# Patient Record
Sex: Female | Born: 1965 | Race: Black or African American | Hispanic: No | Marital: Single
Health system: Southern US, Academic
[De-identification: ages and names within clinical notes are randomized; demographics above are authoritative.]

## PROBLEM LIST (undated history)

## (undated) ENCOUNTER — Ambulatory Visit: Payer: BLUE CROSS/BLUE SHIELD

## (undated) ENCOUNTER — Encounter: Attending: Adult Health | Primary: Adult Health

## (undated) ENCOUNTER — Encounter

## (undated) ENCOUNTER — Ambulatory Visit
Payer: BLUE CROSS/BLUE SHIELD | Attending: Rehabilitative and Restorative Service Providers" | Primary: Rehabilitative and Restorative Service Providers"

## (undated) ENCOUNTER — Ambulatory Visit: Payer: Medicare (Managed Care)

## (undated) ENCOUNTER — Encounter: Attending: Family | Primary: Family

## (undated) ENCOUNTER — Telehealth

## (undated) ENCOUNTER — Ambulatory Visit: Payer: Medicare (Managed Care) | Attending: Family | Primary: Family

## (undated) ENCOUNTER — Encounter: Attending: Internal Medicine | Primary: Internal Medicine

## (undated) ENCOUNTER — Ambulatory Visit

## (undated) ENCOUNTER — Encounter: Attending: Gastroenterology | Primary: Gastroenterology

## (undated) ENCOUNTER — Ambulatory Visit
Attending: Rehabilitative and Restorative Service Providers" | Primary: Rehabilitative and Restorative Service Providers"

## (undated) ENCOUNTER — Ambulatory Visit: Attending: Internal Medicine | Primary: Internal Medicine

## (undated) ENCOUNTER — Non-Acute Institutional Stay: Payer: PRIVATE HEALTH INSURANCE

## (undated) ENCOUNTER — Telehealth: Attending: Adult Health | Primary: Adult Health

## (undated) ENCOUNTER — Ambulatory Visit: Payer: PRIVATE HEALTH INSURANCE

## (undated) ENCOUNTER — Ambulatory Visit: Payer: MEDICAID | Attending: Internal Medicine | Primary: Internal Medicine

## (undated) ENCOUNTER — Ambulatory Visit: Payer: Medicare (Managed Care) | Attending: Internal Medicine | Primary: Internal Medicine

## (undated) ENCOUNTER — Ambulatory Visit: Payer: BLUE CROSS/BLUE SHIELD | Attending: Internal Medicine | Primary: Internal Medicine

## (undated) ENCOUNTER — Telehealth: Attending: Family | Primary: Family

## (undated) ENCOUNTER — Ambulatory Visit: Attending: Physician Assistant | Primary: Physician Assistant

## (undated) ENCOUNTER — Ambulatory Visit: Payer: PRIVATE HEALTH INSURANCE | Attending: Gastroenterology | Primary: Gastroenterology

## (undated) ENCOUNTER — Encounter: Payer: PRIVATE HEALTH INSURANCE | Attending: Gastroenterology | Primary: Gastroenterology

## (undated) ENCOUNTER — Inpatient Hospital Stay: Payer: MEDICARE

## (undated) DIAGNOSIS — E079 Disorder of thyroid, unspecified: Secondary | ICD-10-CM

## (undated) DIAGNOSIS — I10 Essential (primary) hypertension: Secondary | ICD-10-CM

## (undated) DIAGNOSIS — J45909 Unspecified asthma, uncomplicated: Secondary | ICD-10-CM

## (undated) DIAGNOSIS — M1990 Unspecified osteoarthritis, unspecified site: Secondary | ICD-10-CM

## (undated) HISTORY — PX: THYROID SURGERY: SHX805

## (undated) HISTORY — PX: APPENDECTOMY: SHX54

---

## 2019-02-19 ENCOUNTER — Encounter: Payer: Self-pay | Admitting: Emergency Medicine

## 2019-02-19 ENCOUNTER — Other Ambulatory Visit: Payer: Self-pay

## 2019-02-19 ENCOUNTER — Emergency Department: Payer: Self-pay

## 2019-02-19 ENCOUNTER — Emergency Department
Admission: EM | Admit: 2019-02-19 | Discharge: 2019-02-19 | Disposition: A | Discharge status: Home / Self Care | Payer: Self-pay | Attending: Student in an Organized Health Care Education/Training Program | Admitting: Student in an Organized Health Care Education/Training Program

## 2019-02-19 DIAGNOSIS — J45909 Unspecified asthma, uncomplicated: Secondary | ICD-10-CM | POA: Insufficient documentation

## 2019-02-19 DIAGNOSIS — Z87891 Personal history of nicotine dependence: Secondary | ICD-10-CM | POA: Insufficient documentation

## 2019-02-19 DIAGNOSIS — R0602 Shortness of breath: Secondary | ICD-10-CM

## 2019-02-19 DIAGNOSIS — K219 Gastro-esophageal reflux disease without esophagitis: Secondary | ICD-10-CM | POA: Insufficient documentation

## 2019-02-19 DIAGNOSIS — I10 Essential (primary) hypertension: Secondary | ICD-10-CM | POA: Insufficient documentation

## 2019-02-19 DIAGNOSIS — M79605 Pain in left leg: Secondary | ICD-10-CM

## 2019-02-19 DIAGNOSIS — R51 Headache: Secondary | ICD-10-CM | POA: Insufficient documentation

## 2019-02-19 DIAGNOSIS — E039 Hypothyroidism, unspecified: Secondary | ICD-10-CM | POA: Insufficient documentation

## 2019-02-19 HISTORY — DX: Unspecified asthma, uncomplicated: J45.909

## 2019-02-19 HISTORY — DX: Disorder of thyroid, unspecified: E07.9

## 2019-02-19 HISTORY — DX: Unspecified osteoarthritis, unspecified site: M19.90

## 2019-02-19 HISTORY — DX: Essential (primary) hypertension: I10

## 2019-02-19 LAB — CBC
HCT: 42.1 % (ref 36.0–46.0)
Hemoglobin: 14 g/dL (ref 12.0–15.0)
MCH: 29.1 pg (ref 26.0–34.0)
MCHC: 33.3 g/dL (ref 30.0–36.0)
MCV: 87.5 fL (ref 80.0–100.0)
Platelets: 189 10*3/uL (ref 150–400)
RBC: 4.81 MIL/uL (ref 3.87–5.11)
RDW: 14.5 % (ref 11.5–15.5)
WBC: 8.8 10*3/uL (ref 4.0–10.5)
nRBC: 0 % (ref 0.0–0.2)

## 2019-02-19 LAB — BASIC METABOLIC PANEL
Anion gap: 9 (ref 5–15)
BUN: 15 mg/dL (ref 6–20)
CO2: 26 mmol/L (ref 22–32)
Calcium: 9.3 mg/dL (ref 8.9–10.3)
Chloride: 107 mmol/L (ref 98–111)
Creatinine, Ser: 1 mg/dL (ref 0.44–1.00)
GFR calc Af Amer: >60 mL/min (ref >60)
GFR calc non Af Amer: >60 mL/min (ref >60)
Glucose, Bld: 104 mg/dL — ABNORMAL HIGH (ref 70–99)
Potassium: 3.6 mmol/L (ref 3.5–5.1)
Sodium: 142 mmol/L (ref 135–145)

## 2019-02-19 LAB — TROPONIN I (HIGH SENSITIVITY)
Troponin I (High Sensitivity): 3 ng/L (ref <18)
Troponin I (High Sensitivity): <2 ng/L (ref <18)

## 2019-02-19 LAB — POCT PREGNANCY, URINE: Preg Test, Ur: NEGATIVE

## 2019-02-19 LAB — LIPASE, BLOOD: Lipase: 28 U/L (ref 11–51)

## 2019-02-19 MED ORDER — ACETAMINOPHEN 500 MG PO TABS
1000.0000 mg | ORAL_TABLET | Freq: Once | ORAL | Status: AC
  Administered 2019-02-19: 21:00:00 1000 mg via ORAL

## 2019-02-19 MED ORDER — SODIUM CHLORIDE 0.9% FLUSH: 3.0000 mL | Freq: Once | INTRAVENOUS | Status: DC

## 2019-02-19 MED ORDER — ACETAMINOPHEN 500 MG PO TABS
ORAL_TABLET | ORAL | Status: AC
  Filled 2019-02-19: qty 2

## 2019-02-19 NOTE — ED Notes (Signed)
Pt also requesting pain medication. MD made aware and gave verbal order pt could have tylenol.

## 2019-02-19 NOTE — ED Notes (Signed)
RN into room to discharge patient and pt asked about medication to treat a UTI. RN told pt she did not have a urine sample tested and pt insisted a nurse told her she had a UTI and rechecked her temp in the lobby. MD made aware and pt requesting her urine be checked.

## 2019-02-19 NOTE — ED Notes (Signed)
X-ray at bedside

## 2019-02-19 NOTE — ED Provider Notes (Signed)
[facility] Emergency Department Provider Note    First MD Initiated Contact with Patient 02/19/19 1910     (approximate)  I have reviewed the triage vital signs and the nursing notes.   HISTORY  Chief Complaint Chest Pain, Leg Pain, Gastroesophageal Reflux, Abdominal Pain, Shortness of Breath, and Headache    HPI Karen Ewing is a 53 y.o. female the below listed past medical history presents for evaluation of left leg pain.  Denies any trauma.  Also has multiple complaints reported in triage but seems to be that she is complaining of left leg pain is the primary reason for her visit today.  She currently denies any headache or weakness.  No chest pain or shortness of breath.  No fevers.  Is worried that she could have a blood clot.  She is not on any blood thinners.  No new medications.  Does not take a statin.    Past Medical History:  Diagnosis Date  . Arthritis   . Asthma   . Hypertension   . Thyroid disease    No family history on file. Past Surgical History:  Procedure Laterality Date  . APPENDECTOMY    . THYROID SURGERY     There are no active problems to display for this patient.     Prior to Admission medications   Not on File    Allergies Patient has no known allergies.    Social History Social History   Tobacco Use  . Smoking status: Former Games developer  . Smokeless tobacco: Never Used  Substance Use Topics  . Alcohol use: Yes    Comment: occas.   . Drug use: Not on file    Review of Systems Patient denies headaches, rhinorrhea, blurry vision, numbness, shortness of breath, chest pain, edema, cough, abdominal pain, nausea, vomiting, diarrhea, dysuria, fevers, rashes or hallucinations unless otherwise stated above in HPI. ____________________________________________   PHYSICAL EXAM:  VITAL SIGNS: Vitals:   02/19/19 1731 02/19/19 2117  BP: 133/88 120/82  Pulse: 92 90  Resp: 18 16  Temp: 99 F (37.2 C) 98.8 F  (37.1 C)  SpO2: 98% 100%    Constitutional: Alert and oriented.  Eyes: Conjunctivae are normal.  Head: Atraumatic. Nose: No congestion/rhinnorhea. Mouth/Throat: Mucous membranes are moist.   Neck: No stridor. Painless ROM.  Cardiovascular: Normal rate, regular rhythm. Grossly normal heart sounds.  Good peripheral circulation. Respiratory: Normal respiratory effort.  No retractions. Lungs CTAB. Gastrointestinal: Soft and nontender. No distention. No abdominal bruits. No CVA tenderness. Genitourinary:  Musculoskeletal: No lower extremity tenderness nor edema.  Compartments are soft.  Some crepitus with range of motion of the left knee.  Some pain with flexion of the left hip but no overlying warmth or cellulitis.  Has strong PT and DP pulses distally.  No joint effusions. Neurologic:  Normal speech and language. No gross focal neurologic deficits are appreciated. No facial droop Skin:  Skin is warm, dry and intact. No rash noted. Psychiatric: Mood and affect are normal. Speech and behavior are normal.  ____________________________________________   LABS (all labs ordered are listed, but only abnormal results are displayed)  Results for orders placed or performed during the hospital encounter of 02/19/19 (from the past 24 hour(s))  Basic metabolic panel     Status: Abnormal   Collection Time: 02/19/19  5:53 PM  Result Value Ref Range   Sodium 142 135 - 145 mmol/L   Potassium 3.6 3.5 - 5.1 mmol/L   Chloride 107 98 -  111 mmol/L   CO2 26 22 - 32 mmol/L   Glucose, Bld 104 (H) 70 - 99 mg/dL   BUN 15 6 - 20 mg/dL   Creatinine, Ser 1.191.00 0.44 - 1.00 mg/dL   Calcium 9.3 8.9 - 14.710.3 mg/dL   GFR calc non Af Amer >60 >60 mL/min   GFR calc Af Amer >60 >60 mL/min   Anion gap 9 5 - 15  CBC     Status: None   Collection Time: 02/19/19  5:53 PM  Result Value Ref Range   WBC 8.8 4.0 - 10.5 K/uL   RBC 4.81 3.87 - 5.11 MIL/uL   Hemoglobin 14.0 12.0 - 15.0 g/dL   HCT 82.942.1 56.236.0 - 13.046.0 %   MCV  87.5 80.0 - 100.0 fL   MCH 29.1 26.0 - 34.0 pg   MCHC 33.3 30.0 - 36.0 g/dL   RDW 86.514.5 78.411.5 - 69.615.5 %   Platelets 189 150 - 400 K/uL   nRBC 0.0 0.0 - 0.2 %  Troponin I (High Sensitivity)     Status: None   Collection Time: 02/19/19  5:53 PM  Result Value Ref Range   Troponin I (High Sensitivity) 3 <18 ng/L  Lipase, blood     Status: None   Collection Time: 02/19/19  5:53 PM  Result Value Ref Range   Lipase 28 11 - 51 U/L  Pregnancy, urine POC     Status: None   Collection Time: 02/19/19  6:10 PM  Result Value Ref Range   Preg Test, Ur NEGATIVE NEGATIVE  Troponin I (High Sensitivity)     Status: None   Collection Time: 02/19/19  7:48 PM  Result Value Ref Range   Troponin I (High Sensitivity) <2 <18 ng/L   ____________________________________________  EKG My review and personal interpretation at Time: 17:31   Indication: leg pain  Rate: 75  Rhythm: sinus Axis: normal Other: nonspecific st abn, no stemi ____________________________________________  RADIOLOGY  I personally reviewed all radiographic images ordered to evaluate for the above acute complaints and reviewed radiology reports and findings.  These findings were personally discussed with the patient.  Please see medical record for radiology report.  ____________________________________________   PROCEDURES  Procedure(s) performed:  Procedures    Critical Care performed: no ____________________________________________   INITIAL IMPRESSION / ASSESSMENT AND PLAN / ED COURSE  Pertinent labs & imaging results that were available during my care of the patient were reviewed by me and considered in my medical decision making (see chart for details).   DDX: DVT, Baker's cyst, claudication, arthritis, electrolyte abnormality  Karen Ewing is a 53 y.o. who presents to the ED with multiple complaints but primarily seems to be having left leg discomfort.  Blood work is reassuring.  Neuro exam is reassuring.  Not  having active chest pain or pressure at this time.  EKG is abnormal without any clear evidence of acute ischemia.  She is not having any pain at this time.  Likely her baseline given initial troponin being negative but will order serial enzymes.  Will evaluate for DVT with ultrasound.  Seems primarily musculoskeletal.  No evidence of cellulitis.  Has good distal perfusion.  No evidence of ischemic limb.  Her abdominal exam is soft and benign.  She is also reporting intermittent episodes of having trouble grasping things but she has good strength bilaterally with no neck or back pain.     Imaging is reassuring.  Repeat exam soft and benign with again reassuring neuro exam.  He seemed to be primarily chronic issues for the patient at this point I will give her referral to primary care physician.  We discussed signs and symptoms for which she should return to the ER.  The patient was evaluated in Emergency Department today for the symptoms described in the history of present illness. He/she was evaluated in the context of the global COVID-19 pandemic, which necessitated consideration that the patient might be at risk for infection with the SARS-CoV-2 virus that causes COVID-19. Institutional protocols and algorithms that pertain to the evaluation of patients at risk for COVID-19 are in a state of rapid change based on information released by regulatory bodies including the CDC and federal and state organizations. These policies and algorithms were followed during the patient's care in the ED.  As part of my medical decision making, I reviewed the following data within the [facility] notes reviewed and incorporated, Labs reviewed, notes from prior ED visits and [place] Controlled Substance Database   ____________________________________________   FINAL CLINICAL IMPRESSION(S) / ED DIAGNOSES  Final diagnoses:  Left leg pain      NEW MEDICATIONS STARTED DURING THIS VISIT:  There  are no discharge medications for this patient.    Note:  This document was prepared using Dragon voice recognition software and may include unintentional dictation errors.    Merlyn Lot, MD 02/19/19 2205

## 2019-02-19 NOTE — ED Notes (Signed)
US at bedside

## 2019-02-19 NOTE — ED Notes (Signed)
Pt discharged from ED after reporting she did not want to wait for urine. Lab was contacted and confirmed there was a urine sample in lab. After not finding results RN called lab again and discovered there was no urine in lab. Pt did not have urinary symptoms and only had urine ordered after false results were given.

## 2019-02-19 NOTE — Discharge Instructions (Addendum)
Please call [facility] to establish care.  Fortunately, your work up in the ER today is reassuring.  Please return if you have develop any new symptoms or concerns.

## 2019-02-19 NOTE — ED Triage Notes (Signed)
Pt is here with multiple complaints, states heaviness in her head, chest tightness, occasional shob, left leg pain and swelling, abd pain in the mid abd region, GERD, and occasionally drops things that she is holding, only diagnosis is thyroid issues. NAD. All complaints have started in the last couple of weeks.

## 2019-02-19 NOTE — ED Notes (Signed)
See triage note.  Pt states pain to left leg with some swelling, no obvious swelling noted upon assessment.  Pt also states chest tightness and occasionally having to catch her breath, and weakness "jumping from arm to arm".

## 2019-02-19 NOTE — ED Notes (Signed)
ED Provider at bedside. 

## 2019-04-16 ENCOUNTER — Other Ambulatory Visit: Payer: Self-pay

## 2019-04-16 ENCOUNTER — Emergency Department
Admission: EM | Admit: 2019-04-16 | Discharge: 2019-04-16 | Disposition: A | Discharge status: Home / Self Care | Payer: No Typology Code available for payment source | Attending: Emergency Medicine | Admitting: Emergency Medicine

## 2019-04-16 DIAGNOSIS — M79622 Pain in left upper arm: Secondary | ICD-10-CM | POA: Insufficient documentation

## 2019-04-16 DIAGNOSIS — Y92410 Unspecified street and highway as the place of occurrence of the external cause: Secondary | ICD-10-CM | POA: Diagnosis not present

## 2019-04-16 DIAGNOSIS — Y93I9 Activity, other involving external motion: Secondary | ICD-10-CM | POA: Diagnosis not present

## 2019-04-16 DIAGNOSIS — Y999 Unspecified external cause status: Secondary | ICD-10-CM | POA: Insufficient documentation

## 2019-04-16 DIAGNOSIS — M7918 Myalgia, other site: Secondary | ICD-10-CM

## 2019-04-16 DIAGNOSIS — M79621 Pain in right upper arm: Secondary | ICD-10-CM | POA: Insufficient documentation

## 2019-04-16 DIAGNOSIS — M545 Low back pain: Secondary | ICD-10-CM | POA: Diagnosis not present

## 2019-04-16 DIAGNOSIS — J0100 Acute maxillary sinusitis, unspecified: Secondary | ICD-10-CM | POA: Diagnosis not present

## 2019-04-16 DIAGNOSIS — I10 Essential (primary) hypertension: Secondary | ICD-10-CM | POA: Diagnosis not present

## 2019-04-16 DIAGNOSIS — Z87891 Personal history of nicotine dependence: Secondary | ICD-10-CM | POA: Insufficient documentation

## 2019-04-16 DIAGNOSIS — J45909 Unspecified asthma, uncomplicated: Secondary | ICD-10-CM | POA: Insufficient documentation

## 2019-04-16 MED ORDER — PSEUDOEPHEDRINE HCL ER 120 MG PO TB12: 120.0000 mg | ORAL_TABLET | Freq: Two times a day (BID) | ORAL | 0 refills | Status: AC | PRN

## 2019-04-16 MED ORDER — TRAMADOL HCL 50 MG PO TABS: 50.0000 mg | ORAL_TABLET | Freq: Four times a day (QID) | ORAL | 0 refills | Status: DC | PRN

## 2019-04-16 MED ORDER — IBUPROFEN 600 MG PO TABS: 600.0000 mg | ORAL_TABLET | Freq: Three times a day (TID) | ORAL | 0 refills | Status: DC | PRN

## 2019-04-16 MED ORDER — AMOXICILLIN 500 MG PO CAPS: 500.0000 mg | ORAL_CAPSULE | Freq: Three times a day (TID) | ORAL | 0 refills | Status: DC

## 2019-04-16 MED ORDER — PSEUDOEPHEDRINE HCL ER 120 MG PO TB12: 120.0000 mg | ORAL_TABLET | Freq: Two times a day (BID) | ORAL | 0 refills | Status: DC | PRN

## 2019-04-16 MED ORDER — TRAMADOL HCL 50 MG PO TABS: 50.0000 mg | ORAL_TABLET | Freq: Four times a day (QID) | ORAL | 0 refills | Status: AC | PRN

## 2019-04-16 MED ORDER — CYCLOBENZAPRINE HCL 10 MG PO TABS: 10.0000 mg | ORAL_TABLET | Freq: Three times a day (TID) | ORAL | 0 refills | Status: DC | PRN
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
## 2019-04-26 ENCOUNTER — Other Ambulatory Visit: Payer: Self-pay | Admitting: Chiropractor

## 2019-04-26 ENCOUNTER — Ambulatory Visit
Admission: RE | Admit: 2019-04-26 | Discharge: 2019-04-26 | Disposition: A | Discharge status: Home / Self Care | Payer: No Typology Code available for payment source | Attending: Chiropractor | Admitting: Chiropractor

## 2019-04-26 ENCOUNTER — Ambulatory Visit
Admission: RE | Admit: 2019-04-26 | Discharge: 2019-04-26 | Disposition: A | Discharge status: Home / Self Care | Payer: No Typology Code available for payment source | Source: Ambulatory Visit | Attending: Chiropractor | Admitting: Chiropractor

## 2019-04-26 DIAGNOSIS — Y92410 Unspecified street and highway as the place of occurrence of the external cause: Secondary | ICD-10-CM | POA: Diagnosis not present

## 2019-04-26 DIAGNOSIS — M5031 Other cervical disc degeneration,  high cervical region: Secondary | ICD-10-CM | POA: Insufficient documentation

## 2019-04-26 DIAGNOSIS — M50321 Other cervical disc degeneration at C4-C5 level: Secondary | ICD-10-CM | POA: Insufficient documentation

## 2019-07-25 ENCOUNTER — Emergency Department
Admission: EM | Admit: 2019-07-25 | Discharge: 2019-07-25 | Disposition: A | Discharge status: Home / Self Care | Payer: HRSA Program | Attending: Emergency Medicine | Admitting: Emergency Medicine

## 2019-07-25 ENCOUNTER — Other Ambulatory Visit: Payer: Self-pay

## 2019-07-25 DIAGNOSIS — Z87891 Personal history of nicotine dependence: Secondary | ICD-10-CM | POA: Diagnosis not present

## 2019-07-25 DIAGNOSIS — J45909 Unspecified asthma, uncomplicated: Secondary | ICD-10-CM | POA: Insufficient documentation

## 2019-07-25 DIAGNOSIS — Z79899 Other long term (current) drug therapy: Secondary | ICD-10-CM | POA: Diagnosis not present

## 2019-07-25 DIAGNOSIS — U071 COVID-19: Secondary | ICD-10-CM | POA: Insufficient documentation

## 2019-07-25 DIAGNOSIS — J029 Acute pharyngitis, unspecified: Secondary | ICD-10-CM | POA: Diagnosis present

## 2019-07-25 DIAGNOSIS — I10 Essential (primary) hypertension: Secondary | ICD-10-CM | POA: Diagnosis not present

## 2019-07-25 LAB — POC SARS CORONAVIRUS 2 AG: SARS Coronavirus 2 Ag: POSITIVE — AB

## 2019-07-25 MED ORDER — HYDROCODONE-CHLORPHENIRAMINE 5-4 MG/5ML PO SOLN: 5.0000 mL | Freq: Four times a day (QID) | ORAL | 0 refills | Status: DC | PRN

## 2019-07-25 MED ORDER — BENZONATATE 100 MG PO CAPS: 100.0000 mg | ORAL_CAPSULE | Freq: Three times a day (TID) | ORAL | 0 refills | Status: DC | PRN

## 2019-07-25 MED ORDER — ALBUTEROL SULFATE HFA 108 (90 BASE) MCG/ACT IN AERS: INHALATION_SPRAY | RESPIRATORY_TRACT | 1 refills | Status: DC

## 2019-07-25 NOTE — ED Notes (Signed)
Pt reports sore throat and some sinus congestion with slight headache since yesterday. Upon inspection, throat appears minimally inflamed

## 2019-07-25 NOTE — ED Triage Notes (Signed)
Pt in with co sore, scratchy throat since yesterday. States had a fever today and chills.

## 2019-07-25 NOTE — ED Notes (Signed)
Pt reports taking 400 mg ibuprofen at approx 0100

## 2019-07-25 NOTE — ED Provider Notes (Signed)
[facility] Emergency Department Provider Note  ____________________________________________   First MD Initiated Contact with Patient 07/25/19 0215     (approximate)  I have reviewed the triage vital signs and the nursing notes.   HISTORY  Chief Complaint Sore Throat    HPI Karen Ewing is a 54 y.o. female with medical history as listed below who presents by private vehicle for evaluation of sore throat.  She said the symptoms started about 24 hours ago.  Over the last day she has also noticed subjective chills, generalized fatigue, and some generalized muscle aches.  She says the symptoms are mild but they worried her little bit.  She has not noticed a fever.  She denies loss of smell and taste, chest pain, shortness of breath, cough, nausea, vomiting, abdominal pain, and dysuria.  She is speaking without difficulty and eating and drinking and tolerating her secretions without difficulty.  Nothing in particular makes the symptoms better or worse.        Past Medical History:  Diagnosis Date  . Arthritis   . Asthma   . Hypertension   . Thyroid disease     There are no problems to display for this patient.   Past Surgical History:  Procedure Laterality Date  . APPENDECTOMY    . THYROID SURGERY      Prior to Admission medications   Medication Sig Start Date End Date Taking? Authorizing Provider  albuterol (VENTOLIN HFA) 108 (90 Base) MCG/ACT inhaler Inhale 2-4 puffs by mouth every 4 hours as needed for wheezing, cough, and/or shortness of breath 07/25/19   Loleta Rose, MD  amoxicillin (AMOXIL) 500 MG capsule Take 1 capsule (500 mg total) by mouth 3 (three) times daily. 04/16/19   Joni Reining, PA-C  benzonatate (TESSALON PERLES) 100 MG capsule Take 1 capsule (100 mg total) by mouth 3 (three) times daily as needed for cough. 07/25/19   Loleta Rose, MD  cyclobenzaprine (FLEXERIL) 10 MG tablet Take 1 tablet (10 mg total) by mouth 3 (three)  times daily as needed. 04/16/19   Joni Reining, PA-C  HYDROcodone-Chlorpheniramine 5-4 MG/5ML SOLN Take 5 mLs by mouth every 6 (six) hours as needed. 07/25/19   Loleta Rose, MD  ibuprofen (ADVIL) 600 MG tablet Take 1 tablet (600 mg total) by mouth every 8 (eight) hours as needed. 04/16/19   Joni Reining, PA-C  pseudoephedrine (SUDAFED) 120 MG 12 hr tablet Take 1 tablet (120 mg total) by mouth 2 (two) times daily as needed for congestion. 04/16/19 04/15/20  Joni Reining, PA-C  traMADol (ULTRAM) 50 MG tablet Take 1 tablet (50 mg total) by mouth every 6 (six) hours as needed. 04/16/19 04/15/20  Joni Reining, PA-C    Allergies Patient has no known allergies.  No family history on file.  Social History Social History   Tobacco Use  . Smoking status: Former Games developer  . Smokeless tobacco: Never Used  Substance Use Topics  . Alcohol use: Yes    Comment: occas.   . Drug use: Not on file    Review of Systems Constitutional: +chills Eyes: No visual changes ENT: +sore throat. Cardiovascular: Denies chest pain. Respiratory: Denies shortness of breath. Gastrointestinal: No abdominal pain.  No nausea, no vomiting.   Genitourinary: Negative for dysuria. Musculoskeletal: Some mild generalized body aches.  Negative for neck pain.  Negative for back pain. Integumentary: Negative for rash. Neurological: Negative for headaches, focal weakness or numbness.   ____________________________________________   PHYSICAL EXAM:  VITAL SIGNS: ED Triage Vitals [07/25/19 0126]  Enc Vitals Group     BP (!) 153/89     Pulse Rate (!) 108     Resp 20     Temp (!) 100.5 F (38.1 C)     Temp Source Oral     SpO2 99 %     Weight 74.8 kg (165 lb)     Height 1.727 m (5\' 8" )     Head Circumference      Peak Flow      Pain Score 0     Pain Loc      Pain Edu?      Excl. in GC?     Constitutional: Alert and oriented.  Generally well-appearing and in no acute distress. Eyes: Conjunctivae are  normal.  Head: Atraumatic. Nose: No congestion/rhinnorhea. Mouth/Throat: Patient is wearing a mask. Neck: No stridor.  No meningeal signs.   Cardiovascular: Mild tachycardia in triage, resolved with a heart rate in the low 80s when I saw her, regular rhythm. Good peripheral circulation. Grossly normal heart sounds. Respiratory: Normal respiratory effort.  No retractions. Neurologic:  Normal speech and language. No gross focal neurologic deficits are appreciated.  Skin:  Skin is warm, dry and intact. Psychiatric: Mood and affect are normal. Speech and behavior are normal.  ____________________________________________   LABS (all labs ordered are listed, but only abnormal results are displayed)  Labs Reviewed  POC SARS CORONAVIRUS 2 AG - Abnormal; Notable for the following components:      Result Value   SARS Coronavirus 2 Ag POSITIVE (*)    All other components within normal limits  POC SARS CORONAVIRUS 2 AG -  ED   ____________________________________________  EKG  None - EKG not ordered by ED physician ____________________________________________  RADIOLOGY , personally viewed and evaluated these images (plain radiographs) as part of my medical decision making, as well as reviewing the written report by the radiologist.  ED MD interpretation: No indication for emergent imaging  Official radiology report(s): No results found.  ____________________________________________   PROCEDURES   Procedure(s) performed (including Critical Care):  Procedures   ____________________________________________   INITIAL IMPRESSION / MDM / ASSESSMENT AND PLAN / ED COURSE  As part of my medical decision making, I reviewed the following data within the electronic MEDICAL RECORD NUMBER Nursing notes reviewed and incorporated, Labs reviewed , Notes from prior ED visits and [place] Controlled Substance Database   Based on the patient's symptoms I ordered the rapid 15-minute  antigen test which was positive for COVID-19.  This fits clinically.  However her vital signs are very reassuring at this time, her tachycardia has resolved, no oxygen requirement, and clinically the patient looks well and in no distress.  She has not been in contact with anyone known to have COVID-19 but she admits to close contact with a cousin who reportedly was not feeling well within the last couple of weeks.  Fortunately the patient does not require admission at this time.  I had an extended conversation with her were assured my usual and customary COVID-19 management recommendations and return precautions and provided written instructions as well.  She understands and agrees with plan.  I also provided prescriptions as listed below.       ____________________________________________  FINAL CLINICAL IMPRESSION(S) / ED DIAGNOSES  Final diagnoses:  Sore throat  COVID-19     MEDICATIONS GIVEN DURING THIS VISIT:  Medications - No data to display   ED Discharge Orders  Ordered    HYDROcodone-Chlorpheniramine 5-4 MG/5ML SOLN  Every 6 hours PRN     07/25/19 0316    albuterol (VENTOLIN HFA) 108 (90 Base) MCG/ACT inhaler     07/25/19 0316    benzonatate (TESSALON PERLES) 100 MG capsule  3 times daily PRN     07/25/19 0316          *Please note:  Paije Goodhart was evaluated in Emergency Department on 07/25/2019 for the symptoms described in the history of present illness. She was evaluated in the context of the global COVID-19 pandemic, which necessitated consideration that the patient might be at risk for infection with the SARS-CoV-2 virus that causes COVID-19. Institutional protocols and algorithms that pertain to the evaluation of patients at risk for COVID-19 are in a state of rapid change based on information released by regulatory bodies including the CDC and federal and state organizations. These policies and algorithms were followed during the patient's care in the  ED.  Some ED evaluations and interventions may be delayed as a result of limited staffing during the pandemic.*  Note:  This document was prepared using Dragon voice recognition software and may include unintentional dictation errors.   Hinda Kehr, MD 07/25/19 [phone]

## 2019-07-25 NOTE — Discharge Instructions (Signed)
As we discussed, although you have tested positive for COVID-19 (coronavirus), you do not need to be hospitalized at this time.  Read through all the included information including the recommendations from the CDC.  We recommend that you self-quarantine at home with your immediate family only (people with whom you have already been in contact) for 10-14 days after your fever has gone away (without taking medication to make your temperature come down, such as Tylenol (acetaminophen)), after your respiratory symptoms have improved, and after at least 14 days have passed since your symptoms first appeared.  You should have as minimal contact as possible with anyone else including close family as per the CDC paperwork guidelines listed below. Follow-up with your doctor by phone or online as needed and return immediately to the emergency department or call 911 only if you develop new or worsening symptoms that concern you.  If you were prescribed any medications, please use them as instructed.  You can find up-to-date information about COVID-19 in [place] by calling the [place] Coronavirus Helpline: 1-866-462-3821. You may also call 2-1-1, or [phone], or additional resources.  You can also find information online at https://www.ncdhhs.gov/divisions/public-health/coronavirus-disease-2019-covid-19-response-[place], or on the Center for Disease Control (CDC) website at https://www.cdc.gov/coronavirus/2019-ncov/index.html.  

## 2019-07-28 ENCOUNTER — Emergency Department
Admission: EM | Admit: 2019-07-28 | Discharge: 2019-07-28 | Disposition: A | Discharge status: Home / Self Care | Payer: HRSA Program | Attending: Emergency Medicine | Admitting: Emergency Medicine

## 2019-07-28 ENCOUNTER — Other Ambulatory Visit: Payer: Self-pay

## 2019-07-28 ENCOUNTER — Emergency Department: Payer: HRSA Program

## 2019-07-28 ENCOUNTER — Encounter: Payer: Self-pay | Admitting: Emergency Medicine

## 2019-07-28 DIAGNOSIS — I10 Essential (primary) hypertension: Secondary | ICD-10-CM | POA: Insufficient documentation

## 2019-07-28 DIAGNOSIS — Z87891 Personal history of nicotine dependence: Secondary | ICD-10-CM | POA: Diagnosis not present

## 2019-07-28 DIAGNOSIS — J45909 Unspecified asthma, uncomplicated: Secondary | ICD-10-CM | POA: Diagnosis not present

## 2019-07-28 DIAGNOSIS — R5381 Other malaise: Secondary | ICD-10-CM | POA: Diagnosis present

## 2019-07-28 DIAGNOSIS — Z79899 Other long term (current) drug therapy: Secondary | ICD-10-CM | POA: Insufficient documentation

## 2019-07-28 DIAGNOSIS — U071 COVID-19: Secondary | ICD-10-CM | POA: Diagnosis not present

## 2019-07-28 LAB — CBC WITH DIFFERENTIAL/PLATELET
Abs Immature Granulocytes: 0.01 10*3/uL (ref 0.00–0.07)
Basophils Absolute: 0 10*3/uL (ref 0.0–0.1)
Basophils Relative: 1 %
Eosinophils Absolute: 0 10*3/uL (ref 0.0–0.5)
Eosinophils Relative: 0 %
HCT: 46 % (ref 36.0–46.0)
Hemoglobin: 15.4 g/dL — ABNORMAL HIGH (ref 12.0–15.0)
Immature Granulocytes: 0 %
Lymphocytes Relative: 50 %
Lymphs Abs: 3.1 10*3/uL (ref 0.7–4.0)
MCH: 29.1 pg (ref 26.0–34.0)
MCHC: 33.5 g/dL (ref 30.0–36.0)
MCV: 87 fL (ref 80.0–100.0)
Monocytes Absolute: 0.6 10*3/uL (ref 0.1–1.0)
Monocytes Relative: 10 %
Neutro Abs: 2.3 10*3/uL (ref 1.7–7.7)
Neutrophils Relative %: 39 %
Platelets: 180 10*3/uL (ref 150–400)
RBC: 5.29 MIL/uL — ABNORMAL HIGH (ref 3.87–5.11)
RDW: 13.6 % (ref 11.5–15.5)
WBC: 6 10*3/uL (ref 4.0–10.5)
nRBC: 0 % (ref 0.0–0.2)

## 2019-07-28 LAB — BASIC METABOLIC PANEL
Anion gap: 5 (ref 5–15)
BUN: 16 mg/dL (ref 6–20)
CO2: 30 mmol/L (ref 22–32)
Calcium: 9.1 mg/dL (ref 8.9–10.3)
Chloride: 104 mmol/L (ref 98–111)
Creatinine, Ser: 0.88 mg/dL (ref 0.44–1.00)
GFR calc Af Amer: >60 mL/min (ref >60)
GFR calc non Af Amer: >60 mL/min (ref >60)
Glucose, Bld: 93 mg/dL (ref 70–99)
Potassium: 3.8 mmol/L (ref 3.5–5.1)
Sodium: 139 mmol/L (ref 135–145)

## 2019-07-28 MED ORDER — LIDOCAINE VISCOUS HCL 2 % MT SOLN
15.0000 mL | Freq: Once | OROMUCOSAL | Status: AC
  Administered 2019-07-28: 15 mL via OROMUCOSAL
  Filled 2019-07-28: qty 15

## 2019-07-28 MED ORDER — AZITHROMYCIN 250 MG PO TABS: 250.0000 mg | ORAL_TABLET | Freq: Every day | ORAL | 0 refills | Status: AC

## 2019-07-28 MED ORDER — PREDNISONE 20 MG PO TABS
60.0000 mg | ORAL_TABLET | Freq: Once | ORAL | Status: AC
  Administered 2019-07-28: 60 mg via ORAL
  Filled 2019-07-28: qty 3

## 2019-07-28 MED ORDER — AZITHROMYCIN 500 MG PO TABS
500.0000 mg | ORAL_TABLET | Freq: Once | ORAL | Status: AC
  Administered 2019-07-28: 18:00:00 500 mg via ORAL
  Filled 2019-07-28: qty 1

## 2019-07-28 MED ORDER — PREDNISONE 20 MG PO TABS: ORAL_TABLET | ORAL | 0 refills | Status: DC

## 2019-07-28 NOTE — ED Provider Notes (Signed)
[facility] Emergency Department Provider Note ____________________________________________  Time seen: 1553  I have reviewed the triage vital signs and the nursing notes.  HISTORY  Chief Complaint  Generalized Body Aches, Fever, and Weakness  HPI Karen Ewing is a 54 y.o. female with a history of asthma and hypertension, presents herself to the ED for symptoms related to recent Covid diagnosis.  Patient was seen in the ED last week, with a primary complaint of sore throat.  She was found to be positive on her Covid screening test.  She presents today reporting increased malaise, fatigue , and generalized body aches.  She also reports continued sore throat and shortness of breath.  She was discharged last week with Tessalon Perles and cough syrup for symptom relief.  Past Medical History:  Diagnosis Date  . Arthritis   . Asthma   . Hypertension   . Thyroid disease     There are no problems to display for this patient.   Past Surgical History:  Procedure Laterality Date  . APPENDECTOMY    . THYROID SURGERY      Prior to Admission medications   Medication Sig Start Date End Date Taking? Authorizing Provider  albuterol (VENTOLIN HFA) 108 (90 Base) MCG/ACT inhaler Inhale 2-4 puffs by mouth every 4 hours as needed for wheezing, cough, and/or shortness of breath 07/25/19   Hinda Kehr, MD  amoxicillin (AMOXIL) 500 MG capsule Take 1 capsule (500 mg total) by mouth 3 (three) times daily. 04/16/19   Sable Feil, PA-C  azithromycin (ZITHROMAX Z-PAK) 250 MG tablet Take 1 tablet (250 mg total) by mouth daily for 4 days. 07/29/19 08/02/19  Aleksa Catterton, Dannielle Karvonen, PA-C  benzonatate (TESSALON PERLES) 100 MG capsule Take 1 capsule (100 mg total) by mouth 3 (three) times daily as needed for cough. 07/25/19   Hinda Kehr, MD  cyclobenzaprine (FLEXERIL) 10 MG tablet Take 1 tablet (10 mg total) by mouth 3 (three) times daily as needed. 04/16/19   Sable Feil, PA-C   HYDROcodone-Chlorpheniramine 5-4 MG/5ML SOLN Take 5 mLs by mouth every 6 (six) hours as needed. 07/25/19   Hinda Kehr, MD  ibuprofen (ADVIL) 600 MG tablet Take 1 tablet (600 mg total) by mouth every 8 (eight) hours as needed. 04/16/19   Sable Feil, PA-C  predniSONE (DELTASONE) 20 MG tablet Take 3 tabs daily x 2 days; Take 2 tabs daily x 3 days; Take 1 tab daily x 3 days; Take 0.5 tabs daily x 4 days 07/28/19   Cobe Viney, Dannielle Karvonen, PA-C  pseudoephedrine (SUDAFED) 120 MG 12 hr tablet Take 1 tablet (120 mg total) by mouth 2 (two) times daily as needed for congestion. 04/16/19 04/15/20  Sable Feil, PA-C  traMADol (ULTRAM) 50 MG tablet Take 1 tablet (50 mg total) by mouth every 6 (six) hours as needed. 04/16/19 04/15/20  Sable Feil, PA-C    Allergies Patient has no known allergies.  History reviewed. No pertinent family history.  Social History Social History   Tobacco Use  . Smoking status: Former Research scientist (life sciences)  . Smokeless tobacco: Never Used  Substance Use Topics  . Alcohol use: Yes    Comment: occas.   . Drug use: Not on file    Review of Systems  Constitutional: Negative for fever. Eyes: Negative for visual changes. ENT: positive for sore throat. Cardiovascular: Negative for chest pain. Respiratory: Positive for shortness of breath. Gastrointestinal: Negative for abdominal pain, vomiting and diarrhea. Genitourinary: Negative for dysuria. Musculoskeletal:  Negative for back pain. Skin: Negative for rash. Neurological: Negative for headaches, focal weakness or numbness. ____________________________________________  PHYSICAL EXAM:  VITAL SIGNS: ED Triage Vitals [07/28/19 1425]  Enc Vitals Group     BP (!) 150/104     Pulse Rate (!) 121     Resp 18     Temp 99.1 F (37.3 C)     Temp Source Oral     SpO2 99 %     Weight 160 lb (72.6 kg)     Height 5\' 8"  (1.727 m)     Head Circumference      Peak Flow      Pain Score 6     Pain Loc      Pain Edu?      Excl.  in GC?     Constitutional: Alert and oriented. Well appearing and in no distress. Head: Normocephalic and atraumatic. Eyes: Conjunctivae are normal. Normal extraocular movements Mouth/Throat: Mucous membranes are moist. Uvula is midline and tonsils are flat. Single oral ulcer to the lingual side of the gumline of the 3rd molar.  Cardiovascular: Normal rate, regular rhythm. Normal distal pulses. Respiratory: Normal respiratory effort. No wheezes/rales/rhonchi. Gastrointestinal: Soft and nontender. No distention. Musculoskeletal: Nontender with normal range of motion in all extremities.  Neurologic:  Normal gait without ataxia. Normal speech and language. No gross focal neurologic deficits are appreciated. Skin:  Skin is warm, dry and intact. No rash noted. Psychiatric: Mood and affect are normal. Patient exhibits appropriate insight and judgment. ____________________________________________   LABS (pertinent positives/negatives) Labs Reviewed  CBC WITH DIFFERENTIAL/PLATELET - Abnormal; Notable for the following components:      Result Value   RBC 5.29 (*)    Hemoglobin 15.4 (*)    All other components within normal limits  BASIC METABOLIC PANEL  CBC WITH DIFFERENTIAL/PLATELET  ____________________________________________   RADIOLOGY  DG CXR IMPRESSION: No active disease. ____________________________________________  PROCEDURES  Azithromycin 500 mg PO Prednisone 60 mg PO Viscous lido 2% topically Procedures ____________________________________________  INITIAL IMPRESSION / ASSESSMENT AND PLAN / ED COURSE  Patient with ED evaluation of symptoms related to recent Covid diagnosis.  Patient reports increasing malaise and fatigue, as well as some shortness of breath.  She has been taking the as prescribed denies any other benefit patient also been intermittently taking her albuterol inhaler.  She presents now for evaluation.  Labs are reassuring at this time and  chest x-ray is negative for any acute infectious process.  Patient will be treated with prednisone and azithromycin.  She is also encouraged to continue with over-the-counter cough medicine and her previously prescribed inhaler.  She is referred to any one of the local community clinics for ongoing routine care.  Return precautions have been reviewed  Karen Ewing was evaluated in Emergency Department on 07/28/2019 for the symptoms described in the history of present illness. She was evaluated in the context of the global COVID-19 pandemic, which necessitated consideration that the patient might be at risk for infection with the SARS-CoV-2 virus that causes COVID-19. Institutional protocols and algorithms that pertain to the evaluation of patients at risk for COVID-19 are in a state of rapid change based on information released by regulatory bodies including the CDC and federal and state organizations. These policies and algorithms were followed during the patient's care in the ED. ____________________________________________  FINAL CLINICAL IMPRESSION(S) / ED DIAGNOSES  Final diagnoses:  COVID-19 virus infection      Michaeleen Down, 07/30/2019, PA-C 07/28/19 2002  Jene Every, MD 07/28/19 2004

## 2019-07-28 NOTE — ED Notes (Signed)
Pt states she has had increasing body aches since testing positive for COVID on Wednesday. Pt states she has also had low grade fevers intermittantly.

## 2019-07-28 NOTE — Discharge Instructions (Addendum)
You are being treated for COVID symptoms. Take the prescription meds along with your previously prescribed meds. Follow-up with one of the local community clinics for routine care. Return to the ED for worsening symptoms. Remain at home until your symptoms improve.

## 2019-07-28 NOTE — ED Triage Notes (Signed)
Pt to triage states she tested positive for Covid on Wednesday and feels weaker and has more generalized bodyaches.  Pt reports sore throat and some shortness of breath.  NAD noted in triage, resp even and nonlabored, skin warm and dry, pt is afebrile.

## 2019-08-08 ENCOUNTER — Ambulatory Visit: Payer: Self-pay | Attending: Internal Medicine

## 2019-08-08 DIAGNOSIS — Z20822 Contact with and (suspected) exposure to covid-19: Secondary | ICD-10-CM

## 2019-08-09 LAB — NOVEL CORONAVIRUS, NAA: SARS-CoV-2, NAA: NOT DETECTED

## 2019-12-25 ENCOUNTER — Telehealth: Payer: Self-pay

## 2019-12-25 NOTE — Telephone Encounter (Signed)
Individual has been contacted 3+ times regarding ED referral. No further attempts to contact individual will be made. 

## 2020-02-27 ENCOUNTER — Emergency Department: Payer: BLUE CROSS/BLUE SHIELD

## 2020-02-27 ENCOUNTER — Emergency Department
Admission: EM | Admit: 2020-02-27 | Discharge: 2020-02-27 | Disposition: A | Discharge status: Home / Self Care | Payer: BLUE CROSS/BLUE SHIELD | Attending: Emergency Medicine | Admitting: Emergency Medicine

## 2020-02-27 ENCOUNTER — Other Ambulatory Visit: Payer: Self-pay

## 2020-02-27 DIAGNOSIS — E039 Hypothyroidism, unspecified: Secondary | ICD-10-CM | POA: Diagnosis not present

## 2020-02-27 DIAGNOSIS — K219 Gastro-esophageal reflux disease without esophagitis: Secondary | ICD-10-CM | POA: Insufficient documentation

## 2020-02-27 DIAGNOSIS — Z79899 Other long term (current) drug therapy: Secondary | ICD-10-CM | POA: Insufficient documentation

## 2020-02-27 DIAGNOSIS — Z87891 Personal history of nicotine dependence: Secondary | ICD-10-CM | POA: Insufficient documentation

## 2020-02-27 DIAGNOSIS — J45909 Unspecified asthma, uncomplicated: Secondary | ICD-10-CM | POA: Insufficient documentation

## 2020-02-27 DIAGNOSIS — Z7952 Long term (current) use of systemic steroids: Secondary | ICD-10-CM | POA: Insufficient documentation

## 2020-02-27 DIAGNOSIS — R079 Chest pain, unspecified: Secondary | ICD-10-CM | POA: Diagnosis present

## 2020-02-27 DIAGNOSIS — I10 Essential (primary) hypertension: Secondary | ICD-10-CM | POA: Insufficient documentation

## 2020-02-27 DIAGNOSIS — R0789 Other chest pain: Secondary | ICD-10-CM

## 2020-02-27 LAB — CBC
HCT: 42.5 % (ref 36.0–46.0)
Hemoglobin: 14 g/dL (ref 12.0–15.0)
MCH: 29.2 pg (ref 26.0–34.0)
MCHC: 32.9 g/dL (ref 30.0–36.0)
MCV: 88.5 fL (ref 80.0–100.0)
Platelets: 165 10*3/uL (ref 150–400)
RBC: 4.8 MIL/uL (ref 3.87–5.11)
RDW: 14.1 % (ref 11.5–15.5)
WBC: 7.2 10*3/uL (ref 4.0–10.5)
nRBC: 0 % (ref 0.0–0.2)

## 2020-02-27 LAB — BASIC METABOLIC PANEL
Anion gap: 6 (ref 5–15)
BUN: 20 mg/dL (ref 6–20)
CO2: 28 mmol/L (ref 22–32)
Calcium: 9.2 mg/dL (ref 8.9–10.3)
Chloride: 105 mmol/L (ref 98–111)
Creatinine, Ser: 1.2 mg/dL — ABNORMAL HIGH (ref 0.44–1.00)
GFR calc Af Amer: 59 mL/min — ABNORMAL LOW (ref >60)
GFR calc non Af Amer: 51 mL/min — ABNORMAL LOW (ref >60)
Glucose, Bld: 89 mg/dL (ref 70–99)
Potassium: 3.9 mmol/L (ref 3.5–5.1)
Sodium: 139 mmol/L (ref 135–145)

## 2020-02-27 LAB — TROPONIN I (HIGH SENSITIVITY): Troponin I (High Sensitivity): 2 ng/L (ref <18)

## 2020-02-27 MED ORDER — LIDOCAINE VISCOUS HCL 2 % MT SOLN
15.0000 mL | Freq: Once | OROMUCOSAL | Status: AC
  Administered 2020-02-27: 15 mL via ORAL
  Filled 2020-02-27: qty 15

## 2020-02-27 MED ORDER — ALUM & MAG HYDROXIDE-SIMETH 200-200-20 MG/5ML PO SUSP
30.0000 mL | Freq: Once | ORAL | Status: AC
  Administered 2020-02-27: 30 mL via ORAL
  Filled 2020-02-27: qty 30

## 2020-02-27 MED ORDER — ACETAMINOPHEN 500 MG PO TABS
1000.0000 mg | ORAL_TABLET | Freq: Once | ORAL | Status: AC
  Administered 2020-02-27: 1000 mg via ORAL
  Filled 2020-02-27: qty 2

## 2020-02-27 MED ORDER — KETOROLAC TROMETHAMINE 60 MG/2ML IM SOLN
30.0000 mg | Freq: Once | INTRAMUSCULAR | Status: AC
  Administered 2020-02-27: 30 mg via INTRAMUSCULAR
  Filled 2020-02-27: qty 2

## 2020-02-27 NOTE — ED Provider Notes (Signed)
[facility] Emergency Department Provider Note ____________________________________________   First MD Initiated Contact with Patient 02/27/20 1549     (approximate)  I have reviewed the triage vital signs and the nursing notes.  HISTORY  Chief Complaint Chest Pain   HPI Karen Ewing is a 54 y.o. femalewho presents to the ED for evaluation of chest/epigastric pain  Chart review indicates history of hypothyroidism on Synthroid, HTN and asthma with home albuterol.  Patient reports being diagnosed with acid reflux previously and has omeprazole at home.  She reports this medication did nothing for her when she took it in an as needed fashion over the course of 1 week, so she discontinued it herself.  Patient reports multiple months of epigastric and left-sided chest pain that radiates around beneath her left shoulder blade.  She reports this pain has been constant for a matter of months, worsening postprandially.  She denies any vomiting, lower abdominal pain, diarrhea, fevers, cough, shortness of breath or substernal chest pain.  Patient reports a full sensation to her right ear, not resolved with OTC drops.  She denies discharge from either ear or changes in her hearing.  Denies vision changes, headache.  She was reports upper respiratory congestion with clear rhinorrhea for the past 2 days.  Denies sore throat.    Past Medical History:  Diagnosis Date  . Arthritis   . Asthma   . Hypertension   . Thyroid disease     There are no problems to display for this patient.   Past Surgical History:  Procedure Laterality Date  . APPENDECTOMY    . THYROID SURGERY      Prior to Admission medications   Medication Sig Start Date End Date Taking? Authorizing Provider  albuterol (VENTOLIN HFA) 108 (90 Base) MCG/ACT inhaler Inhale 2-4 puffs by mouth every 4 hours as needed for wheezing, cough, and/or shortness of breath 07/25/19   Loleta Rose, MD  amoxicillin  (AMOXIL) 500 MG capsule Take 1 capsule (500 mg total) by mouth 3 (three) times daily. 04/16/19   Joni Reining, PA-C  benzonatate (TESSALON PERLES) 100 MG capsule Take 1 capsule (100 mg total) by mouth 3 (three) times daily as needed for cough. 07/25/19   Loleta Rose, MD  cyclobenzaprine (FLEXERIL) 10 MG tablet Take 1 tablet (10 mg total) by mouth 3 (three) times daily as needed. 04/16/19   Joni Reining, PA-C  HYDROcodone-Chlorpheniramine 5-4 MG/5ML SOLN Take 5 mLs by mouth every 6 (six) hours as needed. 07/25/19   Loleta Rose, MD  ibuprofen (ADVIL) 600 MG tablet Take 1 tablet (600 mg total) by mouth every 8 (eight) hours as needed. 04/16/19   Joni Reining, PA-C  predniSONE (DELTASONE) 20 MG tablet Take 3 tabs daily x 2 days; Take 2 tabs daily x 3 days; Take 1 tab daily x 3 days; Take 0.5 tabs daily x 4 days 07/28/19   Menshew, Charlesetta Ivory, PA-C  pseudoephedrine (SUDAFED) 120 MG 12 hr tablet Take 1 tablet (120 mg total) by mouth 2 (two) times daily as needed for congestion. 04/16/19 04/15/20  Joni Reining, PA-C  traMADol (ULTRAM) 50 MG tablet Take 1 tablet (50 mg total) by mouth every 6 (six) hours as needed. 04/16/19 04/15/20  Joni Reining, PA-C    Allergies Patient has no known allergies.  No family history on file.  Social History Social History   Tobacco Use  . Smoking status: Former Games developer  . Smokeless tobacco: Never Used  Substance Use Topics  . Alcohol use: Yes    Comment: occas.   . Drug use: Not Currently    Review of Systems  Constitutional: No fever/chills Eyes: No visual changes. ENT: No sore throat. Cardiovascular: Positive for chest pain. Respiratory: Denies shortness of breath. Gastrointestinal: Positive for epigastric pain.  No nausea, no vomiting.  No diarrhea.  No constipation. Genitourinary: Negative for dysuria. Musculoskeletal: Negative for back pain. Skin: Negative for rash. Neurological: Negative for headaches, focal weakness or  numbness.   ____________________________________________   PHYSICAL EXAM:  VITAL SIGNS: Vitals:   02/27/20 1341  BP: (!) 153/90  Pulse: 72  Resp: 17  Temp: 98.8 F (37.1 C)  SpO2: 100%      Constitutional: Alert and oriented. Well appearing and in no acute distress. Eyes: Conjunctivae are normal. PERRL. EOMI. Head: Atraumatic. Nose: No congestion/rhinnorhea. Mouth/Throat: Mucous membranes are moist.  Oropharynx non-erythematous. Neck: No stridor. No cervical spine tenderness to palpation. Cardiovascular: Normal rate, regular rhythm. Grossly normal heart sounds.  Good peripheral circulation. Respiratory: Normal respiratory effort.  No retractions. Lungs CTAB. Gastrointestinal: Soft , nondistended. No abdominal bruits. No CVA tenderness.  Minimal epigastric tenderness to palpation without peritoneal features.  Abdomen otherwise benign. Musculoskeletal: No lower extremity tenderness nor edema.  No joint effusions. No signs of acute trauma. Neurologic:  Normal speech and language. No gross focal neurologic deficits are appreciated. No gait instability noted. Skin:  Skin is warm, dry and intact. No rash noted. Psychiatric: Mood and affect are normal. Speech and behavior are normal.  ____________________________________________   LABS (all labs ordered are listed, but only abnormal results are displayed)  Labs Reviewed  BASIC METABOLIC PANEL - Abnormal; Notable for the following components:      Result Value   Creatinine, Ser 1.20 (*)    GFR calc non Af Amer 51 (*)    GFR calc Af Amer 59 (*)    All other components within normal limits  CBC  TROPONIN I (HIGH SENSITIVITY)  TROPONIN I (HIGH SENSITIVITY)   ____________________________________________  12 Lead EKG  Sinus rhythm, rate of 71 bpm.  Normal axis and intervals.  No evidence of acute ischemia. ____________________________________________  RADIOLOGY  ED MD interpretation: 2 view CXR reviewed without evidence  of acute cardiopulmonary pathology.  Official radiology report(s): DG Chest 2 View  Result Date: 02/27/2020 CLINICAL DATA:  Chest pain. EXAM: CHEST - 2 VIEW COMPARISON:  July 28, 2019. FINDINGS: The heart size and mediastinal contours are within normal limits. Both lungs are clear. No pneumothorax or pleural effusion is noted. The visualized skeletal structures are unremarkable. IMPRESSION: No active cardiopulmonary disease. Electronically Signed   By: Lupita Raider M.D.   On: 02/27/2020 14:49    ____________________________________________   PROCEDURES and INTERVENTIONS  Procedure(s) performed (including Critical Care):  Procedures  Medications  alum & mag hydroxide-simeth (MAALOX/MYLANTA) 200-200-20 MG/5ML suspension 30 mL (30 mLs Oral Given 02/27/20 1652)    And  lidocaine (XYLOCAINE) 2 % viscous mouth solution 15 mL (15 mLs Oral Given 02/27/20 1652)  ketorolac (TORADOL) injection 30 mg (30 mg Intramuscular Given 02/27/20 1653)  acetaminophen (TYLENOL) tablet 1,000 mg (1,000 mg Oral Given 02/27/20 1651)    ____________________________________________   MDM / ED COURSE  54 year old woman with history of GERD and without history of ACS, presents with left-sided chest pain and epigastric pain, most consistent with her GERD and amenable to outpatient management with PCP follow-up.  Minimal hypertension, at baseline, vitals blood is normal on room air.  Exam reassuring without evidence of distress, trauma or neurologic deficits.  No skin changes to suggest herpes zoster.  She has minimal epigastric tenderness without peritoneal features, otherwise benign exam.  Blood work is reassuring without derangements.  EKG is nonischemic and troponin is negative.  CXR without pathology.  Patient reports resolution of symptoms after GI cocktail and Toradol administration.  Advised patient to take her home PPI as directed every day, and not as needed.  We discussed return precautions for the ED and  follow-up with your PCP to discuss outpatient stratification testing for her heart.  Patient medically stable for discharge home.  Clinical Course as of Feb 27 1723  Thu Feb 27, 2020  1720 Reassessed.  Patient reports resolution of chest pain.  Discussed outpatient management of GERD and advised her to continue taking her omeprazole.  We discussed return precautions for the ED.   [DS]    Clinical Course User Index [DS] Delton Prairie, MD     ____________________________________________   FINAL CLINICAL IMPRESSION(S) / ED DIAGNOSES  Final diagnoses:  Other chest pain  Gastroesophageal reflux disease without esophagitis     ED Discharge Orders    None       Danta Baumgardner Katrinka Blazing   Note:  This document was prepared using Dragon voice recognition software and may include unintentional dictation errors.   Delton Prairie, MD 02/27/20 1725

## 2020-02-27 NOTE — Discharge Instructions (Signed)
Please take Tylenol and ibuprofen/Advil for your pain.  It is safe to take them together, or to alternate them every few hours.  Take up to 1000mg  of Tylenol at a time, up to 4 times per day.  Do not take more than 4000 mg of Tylenol in 24 hours.  For ibuprofen, take 400-600 mg, 4-5 times per day.  I would also recommend you pick up Sudafed or its generic equivalent from any pharmacy.  It is safe to take this medication with all of your others.  This will help with the congestion you are feeling in your nose and ear.  I would strongly recommend that you continue to take your omeprazole medication for acid reflux.  This is a medicine to take every day, regardless of how you feel, to prevent acid buildup.  It does not work as well if you take as needed.  Return to the ED with any worsening symptoms.

## 2020-02-27 NOTE — ED Triage Notes (Signed)
Pt c/o left chest pain that radiates around to the back, states she has a lot for acid reflux and gas for almost a year and has been to her PCP.Marland Kitchen pt c/o whistling noise and pressure in her right ear.

## 2020-04-01 DIAGNOSIS — G4730 Sleep apnea, unspecified: Principal | ICD-10-CM

## 2020-06-01 ENCOUNTER — Encounter
Admit: 2020-06-01 | Discharge: 2020-06-02 | Payer: PRIVATE HEALTH INSURANCE | Attending: Gastroenterology | Primary: Gastroenterology

## 2020-06-01 DIAGNOSIS — D123 Benign neoplasm of transverse colon: Principal | ICD-10-CM

## 2020-06-01 DIAGNOSIS — R1013 Epigastric pain: Principal | ICD-10-CM

## 2020-06-01 DIAGNOSIS — R079 Chest pain, unspecified: Principal | ICD-10-CM

## 2020-06-01 DIAGNOSIS — Z791 Long term (current) use of non-steroidal anti-inflammatories (NSAID): Principal | ICD-10-CM

## 2020-06-01 DIAGNOSIS — Z79899 Other long term (current) drug therapy: Principal | ICD-10-CM

## 2020-06-01 DIAGNOSIS — E890 Postprocedural hypothyroidism: Principal | ICD-10-CM

## 2020-06-01 DIAGNOSIS — K2950 Unspecified chronic gastritis without bleeding: Principal | ICD-10-CM

## 2020-06-01 DIAGNOSIS — Z86010 Personal history of colonic polyps: Principal | ICD-10-CM

## 2020-06-01 DIAGNOSIS — R197 Diarrhea, unspecified: Principal | ICD-10-CM

## 2020-06-01 DIAGNOSIS — K648 Other hemorrhoids: Principal | ICD-10-CM

## 2020-06-01 DIAGNOSIS — I10 Essential (primary) hypertension: Principal | ICD-10-CM

## 2020-06-01 DIAGNOSIS — K219 Gastro-esophageal reflux disease without esophagitis: Principal | ICD-10-CM

## 2020-06-01 DIAGNOSIS — E079 Disorder of thyroid, unspecified: Principal | ICD-10-CM

## 2020-06-01 DIAGNOSIS — K449 Diaphragmatic hernia without obstruction or gangrene: Principal | ICD-10-CM

## 2020-06-01 DIAGNOSIS — R142 Eructation: Principal | ICD-10-CM

## 2020-06-01 MED ORDER — PEG-ELECTROLYTE SOLUTION 420 GRAM ORAL SOLUTION
Freq: Once | ORAL | 0 refills | 1.00000 days | Status: CP
Start: 2020-06-01 — End: 2020-06-28

## 2020-06-01 MED ORDER — PEG 3350-ELECTROLYTES 236 GRAM-22.74 GRAM-6.74 GRAM-5.86 GRAM SOLUTION
ORAL | 0 refills | 0 days | Status: CP
Start: 2020-06-01 — End: 2020-06-28
  Filled 2020-06-02: qty 4000, 1d supply, fill #0

## 2020-06-02 MED FILL — PEG 3350-ELECTROLYTES 236 GRAM-22.74 GRAM-6.74 GRAM-5.86 GRAM SOLUTION: 1 days supply | Qty: 4000 | Fill #0 | Status: AC

## 2020-06-22 DIAGNOSIS — D123 Benign neoplasm of transverse colon: Principal | ICD-10-CM

## 2020-06-22 DIAGNOSIS — R079 Chest pain, unspecified: Principal | ICD-10-CM

## 2020-06-22 DIAGNOSIS — R197 Diarrhea, unspecified: Principal | ICD-10-CM

## 2020-06-22 DIAGNOSIS — R1013 Epigastric pain: Principal | ICD-10-CM

## 2020-06-22 DIAGNOSIS — K2950 Unspecified chronic gastritis without bleeding: Principal | ICD-10-CM

## 2020-06-22 DIAGNOSIS — E079 Disorder of thyroid, unspecified: Principal | ICD-10-CM

## 2020-06-22 DIAGNOSIS — K449 Diaphragmatic hernia without obstruction or gangrene: Principal | ICD-10-CM

## 2020-06-22 DIAGNOSIS — K648 Other hemorrhoids: Principal | ICD-10-CM

## 2020-06-22 DIAGNOSIS — I10 Essential (primary) hypertension: Principal | ICD-10-CM

## 2020-06-22 DIAGNOSIS — Z79899 Other long term (current) drug therapy: Principal | ICD-10-CM

## 2020-06-22 DIAGNOSIS — R142 Eructation: Principal | ICD-10-CM

## 2020-06-22 DIAGNOSIS — Z791 Long term (current) use of non-steroidal anti-inflammatories (NSAID): Principal | ICD-10-CM

## 2020-06-22 DIAGNOSIS — K219 Gastro-esophageal reflux disease without esophagitis: Principal | ICD-10-CM

## 2020-06-22 DIAGNOSIS — E890 Postprocedural hypothyroidism: Principal | ICD-10-CM

## 2020-06-24 ENCOUNTER — Encounter: Admit: 2020-06-24 | Discharge: 2020-06-28 | Payer: PRIVATE HEALTH INSURANCE

## 2020-06-24 ENCOUNTER — Encounter
Admit: 2020-06-24 | Discharge: 2020-06-28 | Payer: PRIVATE HEALTH INSURANCE | Attending: Anesthesiology | Primary: Anesthesiology

## 2020-06-24 DIAGNOSIS — D123 Benign neoplasm of transverse colon: Principal | ICD-10-CM

## 2020-06-24 DIAGNOSIS — E079 Disorder of thyroid, unspecified: Principal | ICD-10-CM

## 2020-06-24 DIAGNOSIS — R079 Chest pain, unspecified: Principal | ICD-10-CM

## 2020-06-24 DIAGNOSIS — K648 Other hemorrhoids: Principal | ICD-10-CM

## 2020-06-24 DIAGNOSIS — K449 Diaphragmatic hernia without obstruction or gangrene: Principal | ICD-10-CM

## 2020-06-24 DIAGNOSIS — R197 Diarrhea, unspecified: Principal | ICD-10-CM

## 2020-06-24 DIAGNOSIS — R1013 Epigastric pain: Principal | ICD-10-CM

## 2020-06-24 DIAGNOSIS — K219 Gastro-esophageal reflux disease without esophagitis: Principal | ICD-10-CM

## 2020-06-24 DIAGNOSIS — I10 Essential (primary) hypertension: Principal | ICD-10-CM

## 2020-06-24 DIAGNOSIS — K2950 Unspecified chronic gastritis without bleeding: Principal | ICD-10-CM

## 2020-06-24 DIAGNOSIS — Z791 Long term (current) use of non-steroidal anti-inflammatories (NSAID): Principal | ICD-10-CM

## 2020-06-24 DIAGNOSIS — R142 Eructation: Principal | ICD-10-CM

## 2020-06-24 DIAGNOSIS — Z79899 Other long term (current) drug therapy: Principal | ICD-10-CM

## 2020-06-24 DIAGNOSIS — E890 Postprocedural hypothyroidism: Principal | ICD-10-CM

## 2020-07-27 ENCOUNTER — Encounter: Admit: 2020-07-27 | Discharge: 2020-07-28 | Payer: PRIVATE HEALTH INSURANCE

## 2020-07-27 DIAGNOSIS — R079 Chest pain, unspecified: Principal | ICD-10-CM

## 2020-08-03 ENCOUNTER — Emergency Department: Payer: BLUE CROSS/BLUE SHIELD

## 2020-08-03 ENCOUNTER — Emergency Department
Admission: EM | Admit: 2020-08-03 | Discharge: 2020-08-03 | Disposition: A | Discharge status: Home / Self Care | Payer: BLUE CROSS/BLUE SHIELD | Attending: Emergency Medicine | Admitting: Emergency Medicine

## 2020-08-03 ENCOUNTER — Other Ambulatory Visit: Payer: Self-pay

## 2020-08-03 DIAGNOSIS — M542 Cervicalgia: Secondary | ICD-10-CM | POA: Diagnosis not present

## 2020-08-03 DIAGNOSIS — J45909 Unspecified asthma, uncomplicated: Secondary | ICD-10-CM | POA: Diagnosis not present

## 2020-08-03 DIAGNOSIS — R079 Chest pain, unspecified: Secondary | ICD-10-CM | POA: Diagnosis not present

## 2020-08-03 DIAGNOSIS — Z87891 Personal history of nicotine dependence: Secondary | ICD-10-CM | POA: Insufficient documentation

## 2020-08-03 DIAGNOSIS — R519 Headache, unspecified: Secondary | ICD-10-CM | POA: Insufficient documentation

## 2020-08-03 DIAGNOSIS — I10 Essential (primary) hypertension: Secondary | ICD-10-CM | POA: Insufficient documentation

## 2020-08-03 LAB — CBC
HCT: 43.5 % (ref 36.0–46.0)
Hemoglobin: 14.6 g/dL (ref 12.0–15.0)
MCH: 29.1 pg (ref 26.0–34.0)
MCHC: 33.6 g/dL (ref 30.0–36.0)
MCV: 86.8 fL (ref 80.0–100.0)
Platelets: 157 10*3/uL (ref 150–400)
RBC: 5.01 MIL/uL (ref 3.87–5.11)
RDW: 13.5 % (ref 11.5–15.5)
WBC: 6.9 10*3/uL (ref 4.0–10.5)
nRBC: 0 % (ref 0.0–0.2)

## 2020-08-03 LAB — COMPREHENSIVE METABOLIC PANEL
ALT: 15 U/L (ref 0–44)
AST: 18 U/L (ref 15–41)
Albumin: 3.8 g/dL (ref 3.5–5.0)
Alkaline Phosphatase: 83 U/L (ref 38–126)
Anion gap: 8 (ref 5–15)
BUN: 10 mg/dL (ref 6–20)
CO2: 24 mmol/L (ref 22–32)
Calcium: 9.1 mg/dL (ref 8.9–10.3)
Chloride: 106 mmol/L (ref 98–111)
Creatinine, Ser: 0.95 mg/dL (ref 0.44–1.00)
GFR, Estimated: >60 mL/min (ref >60)
Glucose, Bld: 100 mg/dL — ABNORMAL HIGH (ref 70–99)
Potassium: 4.2 mmol/L (ref 3.5–5.1)
Sodium: 138 mmol/L (ref 135–145)
Total Bilirubin: 0.7 mg/dL (ref 0.3–1.2)
Total Protein: 7.5 g/dL (ref 6.5–8.1)

## 2020-08-03 LAB — DIFFERENTIAL
Abs Immature Granulocytes: 0.02 10*3/uL (ref 0.00–0.07)
Basophils Absolute: 0 10*3/uL (ref 0.0–0.1)
Basophils Relative: 1 %
Eosinophils Absolute: 0.1 10*3/uL (ref 0.0–0.5)
Eosinophils Relative: 1 %
Immature Granulocytes: 0 %
Lymphocytes Relative: 52 %
Lymphs Abs: 3.6 10*3/uL (ref 0.7–4.0)
Monocytes Absolute: 0.6 10*3/uL (ref 0.1–1.0)
Monocytes Relative: 9 %
Neutro Abs: 2.6 10*3/uL (ref 1.7–7.7)
Neutrophils Relative %: 37 %

## 2020-08-03 LAB — APTT: aPTT: 30 seconds (ref 24–36)

## 2020-08-03 LAB — PROTIME-INR
INR: 1 (ref 0.8–1.2)
Prothrombin Time: 12.3 seconds (ref 11.4–15.2)

## 2020-08-03 LAB — TROPONIN I (HIGH SENSITIVITY): Troponin I (High Sensitivity): 3 ng/L (ref <18)

## 2020-08-03 MED ORDER — ACETAMINOPHEN 500 MG PO TABS
1000.0000 mg | ORAL_TABLET | Freq: Once | ORAL | Status: AC
  Administered 2020-08-03: 1000 mg via ORAL
  Filled 2020-08-03: qty 2

## 2020-08-03 MED ORDER — SODIUM CHLORIDE 0.9% FLUSH
3.0000 mL | Freq: Once | INTRAVENOUS | Status: AC
  Administered 2020-08-03: 3 mL via INTRAVENOUS

## 2020-08-03 MED ORDER — PROCHLORPERAZINE EDISYLATE 10 MG/2ML IJ SOLN
10.0000 mg | Freq: Once | INTRAMUSCULAR | Status: AC
  Administered 2020-08-03: 10 mg via INTRAVENOUS
  Filled 2020-08-03: qty 2

## 2020-08-03 MED ORDER — KETOROLAC TROMETHAMINE 30 MG/ML IJ SOLN: 30.0000 mg | Freq: Once | INTRAMUSCULAR | Status: DC

## 2020-08-03 MED ORDER — KETOROLAC TROMETHAMINE 30 MG/ML IJ SOLN
15.0000 mg | Freq: Once | INTRAMUSCULAR | Status: AC
  Administered 2020-08-03: 15 mg via INTRAVENOUS
  Filled 2020-08-03: qty 1

## 2020-08-03 MED ORDER — IOHEXOL 350 MG/ML SOLN
75.0000 mL | Freq: Once | INTRAVENOUS | Status: AC | PRN
  Administered 2020-08-03: 75 mL via INTRAVENOUS

## 2020-08-03 MED ORDER — DIPHENHYDRAMINE HCL 50 MG/ML IJ SOLN
25.0000 mg | Freq: Once | INTRAMUSCULAR | Status: AC
  Administered 2020-08-03: 25 mg via INTRAVENOUS
  Filled 2020-08-03: qty 1

## 2020-08-03 NOTE — ED Provider Notes (Signed)
[facility] Emergency Department Provider Note   ____________________________________________   Event Date/Time   First MD Initiated Contact with Patient 08/03/20 2039     (approximate)  I have reviewed the triage vital signs and the nursing notes.   HISTORY  Chief Complaint Headache and Neck Pain    HPI Karen Ewing is a 55 y.o. female with past medical history of hypertension, asthma, and arthritis who presents to the ED complaining of headache and neck pain.  Patient reports that a couple of weeks ago she first developed pain over the left side of her face and head.  Pain has been present intermittently since then, can come on at any time and is not exacerbated or alleviated by anything.  It is most severe over her left parietal scalp but also extends down the back of her neck and into her left shoulder as well as scapular area.  She denies any recent trauma to her head or neck area.  She has not had any numbness or weakness, denies any vision or speech changes.  She does state that she will occasionally feel unsteady on her feet.  She has not had any fevers or neck stiffness.  She became more concerned today when she felt like she was having a muscle spasm in the left side of her face.  She has not taken any medications for this or been previously evaluated.  She does states she has also been dealing with intermittent pain in her chest for multiple weeks now, has been diagnosed with GERD by her PCP.  She has no chest pain currently and denies any fevers, cough, or shortness of breath.        Past Medical History:  Diagnosis Date  . Arthritis   . Asthma   . Hypertension   . Thyroid disease     There are no problems to display for this patient.   Past Surgical History:  Procedure Laterality Date  . APPENDECTOMY    . THYROID SURGERY      Prior to Admission medications   Medication Sig Start Date End Date Taking? Authorizing Provider   albuterol (VENTOLIN HFA) 108 (90 Base) MCG/ACT inhaler Inhale 2-4 puffs by mouth every 4 hours as needed for wheezing, cough, and/or shortness of breath 07/25/19   Loleta Rose, MD  amoxicillin (AMOXIL) 500 MG capsule Take 1 capsule (500 mg total) by mouth 3 (three) times daily. 04/16/19   Joni Reining, PA-C  benzonatate (TESSALON PERLES) 100 MG capsule Take 1 capsule (100 mg total) by mouth 3 (three) times daily as needed for cough. 07/25/19   Loleta Rose, MD  cyclobenzaprine (FLEXERIL) 10 MG tablet Take 1 tablet (10 mg total) by mouth 3 (three) times daily as needed. 04/16/19   Joni Reining, PA-C  HYDROcodone-Chlorpheniramine 5-4 MG/5ML SOLN Take 5 mLs by mouth every 6 (six) hours as needed. 07/25/19   Loleta Rose, MD  ibuprofen (ADVIL) 600 MG tablet Take 1 tablet (600 mg total) by mouth every 8 (eight) hours as needed. 04/16/19   Joni Reining, PA-C  predniSONE (DELTASONE) 20 MG tablet Take 3 tabs daily x 2 days; Take 2 tabs daily x 3 days; Take 1 tab daily x 3 days; Take 0.5 tabs daily x 4 days 07/28/19   Menshew, Charlesetta Ivory, PA-C    Allergies Patient has no known allergies.  No family history on file.  Social History Social History   Tobacco Use  . Smoking status: Former  Smoker  . Smokeless tobacco: Never Used  Substance Use Topics  . Alcohol use: Yes    Comment: occas.   . Drug use: Not Currently    Review of Systems  Constitutional: No fever/chills Eyes: No visual changes. ENT: No sore throat. Cardiovascular: Positive for chest pain. Respiratory: Denies shortness of breath. Gastrointestinal: No abdominal pain.  No nausea, no vomiting.  No diarrhea.  No constipation. Genitourinary: Negative for dysuria. Musculoskeletal: Negative for back pain.  Positive for neck pain. Skin: Negative for rash. Neurological: Positive for headaches, negative for focal weakness or numbness.  Positive for dizziness.  ____________________________________________   PHYSICAL  EXAM:  VITAL SIGNS: ED Triage Vitals  Enc Vitals Group     BP 08/03/20 1758 (!) 147/99     Pulse Rate 08/03/20 1758 98     Resp 08/03/20 1758 18     Temp 08/03/20 1758 98.3 F (36.8 C)     Temp Source 08/03/20 1758 Oral     SpO2 08/03/20 1758 100 %     Weight 08/03/20 1816 160 lb (72.6 kg)     Height 08/03/20 1816 5\' 8"  (1.727 m)     Head Circumference --      Peak Flow --      Pain Score 08/03/20 1758 8     Pain Loc --      Pain Edu? --      Excl. in GC? --     Constitutional: Alert and oriented. Eyes: Conjunctivae are normal. Head: Atraumatic.  No temporal tenderness bilaterally. Nose: No congestion/rhinnorhea. Mouth/Throat: Mucous membranes are moist.  Left TM clear. Neck: Normal ROM, no midline cervical spine tenderness.  Mild lateral neck tenderness, tenderness noted over left trapezius. Cardiovascular: Normal rate, regular rhythm. Grossly normal heart sounds.  2+ radial pulses bilaterally. Respiratory: Normal respiratory effort.  No retractions. Lungs CTAB. Gastrointestinal: Soft and nontender. No distention. Genitourinary: deferred Musculoskeletal: No lower extremity tenderness nor edema. Neurologic:  Normal speech and language. No gross focal neurologic deficits are appreciated. Skin:  Skin is warm, dry and intact. No rash noted. Psychiatric: Mood and affect are normal. Speech and behavior are normal.  ____________________________________________   LABS (all labs ordered are listed, but only abnormal results are displayed)  Labs Reviewed  COMPREHENSIVE METABOLIC PANEL - Abnormal; Notable for the following components:      Result Value   Glucose, Bld 100 (*)    All other components within normal limits  PROTIME-INR  APTT  CBC  DIFFERENTIAL  TROPONIN I (HIGH SENSITIVITY)   ____________________________________________  EKG  ED ECG REPORT I, 08/05/20, the attending physician, personally viewed and interpreted this ECG.   Date: 08/03/2020  EKG  Time: 18:19  Rate: 89  Rhythm: normal sinus rhythm  Axis: LAD  Intervals:none  ST&T Change: None   PROCEDURES  Procedure(s) performed (including Critical Care):  Procedures   ____________________________________________   INITIAL IMPRESSION / ASSESSMENT AND PLAN / ED COURSE       55 year old female with past medical history of hypertension and asthma who presents to the ED with intermittent left head and face pain for the past couple of weeks that can extend down the back of her neck and into the area of her left shoulder.  CT head reviewed by me and shows no acute hemorrhage, negative for acute process per radiology.  Labs are unremarkable and patient's pain is reproducible with palpation along her left trapezius along with her lateral neck.  With her facial paresthesias and dizziness,  we will further assess with CTA for carotid or vertebral artery dissection.  We will treat ongoing headache with Compazine and Benadryl.  Work-up for her chest pain is unremarkable and this appears to be a chronic issue for her.  Symptoms sound consistent with GERD.  Patient turned over to oncoming provider pending CTA results.  If these are unremarkable, I have low suspicion for acute stroke given symptoms intermittent over a couple of weeks and patient would be appropriate for discharge home with PCP follow-up.      ____________________________________________   FINAL CLINICAL IMPRESSION(S) / ED DIAGNOSES  Final diagnoses:  Acute nonintractable headache, unspecified headache type  Chest pain, unspecified type     ED Discharge Orders    None       Note:  This document was prepared using Dragon voice recognition software and may include unintentional dictation errors.   Chesley Noon, MD 08/03/20 2303

## 2020-08-03 NOTE — Discharge Instructions (Addendum)
Please take Tylenol and ibuprofen/Advil for your pain.  It is safe to take them together, or to alternate them every few hours.  Take up to 1000mg  of Tylenol at a time, up to 4 times per day.  Do not take more than 4000 mg of Tylenol in 24 hours.  For ibuprofen, take 400-600 mg, 4-5 times per day.  Return to the ED if you have any worsening headaches despite these medications. Particularly headaches with fever, passing out, stroke-like symptoms, or sudden and severe.

## 2020-08-03 NOTE — ED Triage Notes (Addendum)
Pt comes with c/o left sided face pain, neck and headache. Pt states she feels pressure in her head and side of face.  Pt states some weakness and dizziness. Pt states she feels her balance is off.  Pt states this has been going on for few days.  Pt also states central CP that has been going on for awhile.

## 2020-08-04 NOTE — ED Provider Notes (Signed)
  Patient received in signout from Dr. Larinda Buttery pending CTA neck and head due to subacute neck and head pain.  These images were reviewed with by me without evidence of acute intracranial pathology, carotid dissection.  I reassessed the patient and she reports improving symptoms, but still some persistent headache.  She looks well without any notable neurologic deficits.  We discussed the addition of Tylenol and Toradol, and she is agreeable.  We discussed return precautions for the ED.  Patient is stable for outpatient management per initial providers plan.   Delton Prairie, MD 08/04/20 [phone]

## 2020-09-25 DIAGNOSIS — R079 Chest pain, unspecified: Principal | ICD-10-CM

## 2020-10-06 ENCOUNTER — Ambulatory Visit: Admit: 2020-10-06 | Discharge: 2020-10-07 | Payer: PRIVATE HEALTH INSURANCE

## 2020-10-20 DIAGNOSIS — R9439 Abnormal result of other cardiovascular function study: Principal | ICD-10-CM

## 2020-11-19 ENCOUNTER — Ambulatory Visit
Admit: 2020-11-19 | Discharge: 2020-11-20 | Payer: PRIVATE HEALTH INSURANCE | Attending: Adult Health | Primary: Adult Health

## 2020-11-19 DIAGNOSIS — R9439 Abnormal result of other cardiovascular function study: Principal | ICD-10-CM

## 2020-12-03 ENCOUNTER — Encounter: Admit: 2020-12-03 | Discharge: 2020-12-04 | Payer: PRIVATE HEALTH INSURANCE

## 2020-12-04 ENCOUNTER — Encounter: Admit: 2020-12-04 | Discharge: 2020-12-05 | Payer: PRIVATE HEALTH INSURANCE

## 2020-12-04 DIAGNOSIS — R079 Chest pain, unspecified: Principal | ICD-10-CM

## 2020-12-25 ENCOUNTER — Encounter: Admit: 2020-12-25 | Discharge: 2020-12-25 | Payer: PRIVATE HEALTH INSURANCE

## 2020-12-25 MED ORDER — METOPROLOL SUCCINATE ER 25 MG TABLET,EXTENDED RELEASE 24 HR
ORAL_TABLET | Freq: Every day | ORAL | 0 refills | 30 days | Status: CP
Start: 2020-12-25 — End: 2021-01-24

## 2020-12-25 MED ORDER — ATORVASTATIN 20 MG TABLET
ORAL_TABLET | Freq: Every day | ORAL | 0 refills | 30 days | Status: CP
Start: 2020-12-25 — End: 2021-01-24

## 2021-01-04 ENCOUNTER — Encounter
Admit: 2021-01-04 | Discharge: 2021-01-05 | Payer: PRIVATE HEALTH INSURANCE | Attending: Adult Health | Primary: Adult Health

## 2021-01-04 DIAGNOSIS — I10 Essential (primary) hypertension: Secondary | ICD-10-CM | POA: Insufficient documentation

## 2021-01-04 MED ORDER — METOPROLOL SUCCINATE ER 25 MG TABLET,EXTENDED RELEASE 24 HR
ORAL_TABLET | Freq: Every day | ORAL | 1 refills | 90 days | Status: CP
Start: 2021-01-04 — End: ?

## 2021-01-04 MED ORDER — ATORVASTATIN 20 MG TABLET
ORAL_TABLET | Freq: Every day | ORAL | 1 refills | 90 days | Status: CP
Start: 2021-01-04 — End: ?

## 2021-02-04 ENCOUNTER — Ambulatory Visit
Admit: 2021-02-04 | Payer: PRIVATE HEALTH INSURANCE | Attending: Rehabilitative and Restorative Service Providers" | Primary: Rehabilitative and Restorative Service Providers"

## 2021-02-09 DIAGNOSIS — M549 Dorsalgia, unspecified: Principal | ICD-10-CM

## 2021-03-06 ENCOUNTER — Ambulatory Visit
Admit: 2021-03-06 | Discharge: 2021-03-06 | Disposition: A | Discharge status: Home / Self Care | Payer: PRIVATE HEALTH INSURANCE | Attending: Student in an Organized Health Care Education/Training Program

## 2021-03-06 ENCOUNTER — Emergency Department
Admit: 2021-03-06 | Discharge: 2021-03-06 | Disposition: A | Discharge status: Home / Self Care | Payer: PRIVATE HEALTH INSURANCE | Attending: Student in an Organized Health Care Education/Training Program

## 2021-03-06 DIAGNOSIS — R109 Unspecified abdominal pain: Principal | ICD-10-CM

## 2021-03-06 DIAGNOSIS — R300 Dysuria: Principal | ICD-10-CM

## 2021-04-05 ENCOUNTER — Ambulatory Visit: Admit: 2021-04-05 | Payer: PRIVATE HEALTH INSURANCE

## 2021-06-24 ENCOUNTER — Ambulatory Visit
Admit: 2021-06-24 | Discharge: 2021-06-25 | Disposition: A | Discharge status: Home / Self Care | Payer: PRIVATE HEALTH INSURANCE | Attending: Emergency Medicine

## 2021-06-25 MED ORDER — SUCRALFATE 1 GRAM TABLET
ORAL_TABLET | Freq: Four times a day (QID) | ORAL | 0 refills | 30 days | Status: CP
Start: 2021-06-25 — End: 2021-07-25

## 2021-06-25 MED ORDER — DICYCLOMINE 20 MG TABLET
ORAL_TABLET | Freq: Two times a day (BID) | ORAL | 0 refills | 10 days | Status: CP
Start: 2021-06-25 — End: 2021-07-05

## 2021-07-21 ENCOUNTER — Ambulatory Visit
Admit: 2021-07-21 | Discharge: 2021-07-22 | Payer: PRIVATE HEALTH INSURANCE | Attending: Gastroenterology | Primary: Gastroenterology

## 2021-07-28 ENCOUNTER — Institutional Professional Consult (permissible substitution): Admit: 2021-07-28 | Discharge: 2021-07-29 | Payer: PRIVATE HEALTH INSURANCE

## 2021-07-28 DIAGNOSIS — R140 Abdominal distension (gaseous): Principal | ICD-10-CM

## 2021-07-29 ENCOUNTER — Ambulatory Visit: Admit: 2021-07-29 | Discharge: 2021-07-30 | Payer: PRIVATE HEALTH INSURANCE

## 2021-07-29 MED ORDER — METRONIDAZOLE 500 MG TABLET
ORAL_TABLET | Freq: Two times a day (BID) | ORAL | 0 refills | 7 days | Status: CP
Start: 2021-07-29 — End: 2021-08-05

## 2021-08-04 ENCOUNTER — Ambulatory Visit: Admit: 2021-08-04 | Payer: PRIVATE HEALTH INSURANCE

## 2021-08-09 ENCOUNTER — Other Ambulatory Visit: Payer: Self-pay | Admitting: Family Medicine

## 2021-08-09 DIAGNOSIS — Z1231 Encounter for screening mammogram for malignant neoplasm of breast: Secondary | ICD-10-CM

## 2021-08-10 ENCOUNTER — Ambulatory Visit
Admission: RE | Admit: 2021-08-10 | Discharge: 2021-08-10 | Disposition: A | Discharge status: Home / Self Care | Payer: BLUE CROSS/BLUE SHIELD | Source: Ambulatory Visit | Attending: Family Medicine | Admitting: Family Medicine

## 2021-08-10 ENCOUNTER — Other Ambulatory Visit: Payer: Self-pay

## 2021-08-10 DIAGNOSIS — Z1231 Encounter for screening mammogram for malignant neoplasm of breast: Secondary | ICD-10-CM | POA: Insufficient documentation

## 2021-08-12 ENCOUNTER — Ambulatory Visit: Admit: 2021-08-12 | Discharge: 2021-08-12 | Payer: PRIVATE HEALTH INSURANCE

## 2021-08-25 ENCOUNTER — Ambulatory Visit: Admit: 2021-08-25 | Discharge: 2021-08-26 | Payer: PRIVATE HEALTH INSURANCE

## 2021-10-03 ENCOUNTER — Ambulatory Visit: Payer: PRIVATE HEALTH INSURANCE

## 2021-10-03 DIAGNOSIS — G8929 Other chronic pain: Principal | ICD-10-CM

## 2021-10-03 DIAGNOSIS — M5432 Sciatica, left side: Principal | ICD-10-CM

## 2021-10-03 DIAGNOSIS — M545 Acute exacerbation of chronic low back pain: Principal | ICD-10-CM

## 2021-10-03 MED ORDER — NAPROXEN 500 MG TABLET
ORAL_TABLET | Freq: Two times a day (BID) | ORAL | 0 refills | 30 days | Status: CP | PRN
Start: 2021-10-03 — End: ?

## 2021-10-03 MED ORDER — METHYLPREDNISOLONE 4 MG TABLETS IN A DOSE PACK
0 refills | 0 days | Status: CP
Start: 2021-10-03 — End: ?

## 2021-10-03 MED ORDER — DIAZEPAM 5 MG TABLET
ORAL_TABLET | Freq: Two times a day (BID) | ORAL | 0 refills | 5 days | Status: CP | PRN
Start: 2021-10-03 — End: 2021-10-08

## 2021-10-03 MED ORDER — OXYCODONE 5 MG TABLET
ORAL_TABLET | ORAL | 0 refills | 2 days | Status: CP | PRN
Start: 2021-10-03 — End: 2021-10-08

## 2021-10-12 ENCOUNTER — Ambulatory Visit
Admit: 2021-10-12 | Discharge: 2021-10-13 | Payer: PRIVATE HEALTH INSURANCE | Attending: Rehabilitative and Restorative Service Providers" | Primary: Rehabilitative and Restorative Service Providers"

## 2021-10-19 ENCOUNTER — Ambulatory Visit
Admit: 2021-10-19 | Discharge: 2021-10-20 | Payer: PRIVATE HEALTH INSURANCE | Attending: Rehabilitative and Restorative Service Providers" | Primary: Rehabilitative and Restorative Service Providers"

## 2021-10-26 ENCOUNTER — Ambulatory Visit
Admit: 2021-10-26 | Discharge: 2021-10-27 | Payer: PRIVATE HEALTH INSURANCE | Attending: Rehabilitative and Restorative Service Providers" | Primary: Rehabilitative and Restorative Service Providers"

## 2021-10-26 DIAGNOSIS — N819 Female genital prolapse, unspecified: Principal | ICD-10-CM

## 2021-10-26 DIAGNOSIS — G8929 Other chronic pain: Principal | ICD-10-CM

## 2021-10-26 DIAGNOSIS — K5902 Outlet dysfunction constipation: Principal | ICD-10-CM

## 2021-10-26 DIAGNOSIS — N3941 Urge incontinence: Principal | ICD-10-CM

## 2021-10-26 DIAGNOSIS — M5442 Lumbago with sciatica, left side: Principal | ICD-10-CM

## 2021-10-28 ENCOUNTER — Ambulatory Visit: Admit: 2021-10-28 | Discharge: 2021-10-29 | Payer: PRIVATE HEALTH INSURANCE

## 2021-11-02 ENCOUNTER — Ambulatory Visit
Admit: 2021-11-02 | Discharge: 2021-11-03 | Payer: PRIVATE HEALTH INSURANCE | Attending: Rehabilitative and Restorative Service Providers" | Primary: Rehabilitative and Restorative Service Providers"

## 2021-11-09 ENCOUNTER — Ambulatory Visit
Admit: 2021-11-09 | Discharge: 2021-11-10 | Payer: PRIVATE HEALTH INSURANCE | Attending: Rehabilitative and Restorative Service Providers" | Primary: Rehabilitative and Restorative Service Providers"

## 2021-12-09 ENCOUNTER — Ambulatory Visit
Admit: 2021-12-09 | Discharge: 2021-12-10 | Disposition: A | Discharge status: Home / Self Care | Payer: PRIVATE HEALTH INSURANCE | Attending: Emergency Medicine

## 2021-12-09 ENCOUNTER — Ambulatory Visit
Admit: 2021-12-09 | Discharge: 2021-12-10 | Payer: PRIVATE HEALTH INSURANCE | Attending: Rehabilitative and Restorative Service Providers" | Primary: Rehabilitative and Restorative Service Providers"

## 2021-12-09 ENCOUNTER — Emergency Department
Admit: 2021-12-09 | Discharge: 2021-12-10 | Disposition: A | Discharge status: Home / Self Care | Payer: PRIVATE HEALTH INSURANCE | Attending: Emergency Medicine

## 2021-12-09 DIAGNOSIS — R1010 Upper abdominal pain, unspecified: Principal | ICD-10-CM

## 2021-12-09 DIAGNOSIS — R42 Dizziness and giddiness: Principal | ICD-10-CM

## 2021-12-09 MED ORDER — SUCRALFATE 1 GRAM TABLET
ORAL_TABLET | Freq: Four times a day (QID) | ORAL | 0 refills | 30 days | Status: CP
Start: 2021-12-09 — End: 2022-01-08

## 2021-12-21 ENCOUNTER — Ambulatory Visit
Admit: 2021-12-21 | Discharge: 2021-12-22 | Payer: PRIVATE HEALTH INSURANCE | Attending: Rehabilitative and Restorative Service Providers" | Primary: Rehabilitative and Restorative Service Providers"

## 2021-12-28 ENCOUNTER — Ambulatory Visit
Admit: 2021-12-28 | Payer: PRIVATE HEALTH INSURANCE | Attending: Rehabilitative and Restorative Service Providers" | Primary: Rehabilitative and Restorative Service Providers"

## 2022-01-04 ENCOUNTER — Ambulatory Visit
Admit: 2022-01-04 | Discharge: 2022-01-05 | Payer: PRIVATE HEALTH INSURANCE | Attending: Rehabilitative and Restorative Service Providers" | Primary: Rehabilitative and Restorative Service Providers"

## 2022-01-06 ENCOUNTER — Ambulatory Visit
Admit: 2022-01-06 | Discharge: 2022-01-07 | Payer: PRIVATE HEALTH INSURANCE | Attending: Student in an Organized Health Care Education/Training Program | Primary: Student in an Organized Health Care Education/Training Program

## 2022-01-06 MED ORDER — OMEPRAZOLE 40 MG CAPSULE,DELAYED RELEASE
ORAL_CAPSULE | Freq: Every day | ORAL | 3 refills | 90 days | Status: CP
Start: 2022-01-06 — End: 2023-01-06

## 2022-01-11 ENCOUNTER — Ambulatory Visit
Admit: 2022-01-11 | Discharge: 2022-01-12 | Payer: PRIVATE HEALTH INSURANCE | Attending: Rehabilitative and Restorative Service Providers" | Primary: Rehabilitative and Restorative Service Providers"

## 2022-01-17 ENCOUNTER — Encounter
Admit: 2022-01-17 | Discharge: 2022-01-17 | Payer: PRIVATE HEALTH INSURANCE | Attending: Nurse Practitioner | Primary: Nurse Practitioner

## 2022-01-17 ENCOUNTER — Ambulatory Visit: Admit: 2022-01-17 | Discharge: 2022-01-17 | Payer: PRIVATE HEALTH INSURANCE

## 2022-01-18 ENCOUNTER — Ambulatory Visit
Admit: 2022-01-18 | Payer: PRIVATE HEALTH INSURANCE | Attending: Rehabilitative and Restorative Service Providers" | Primary: Rehabilitative and Restorative Service Providers"

## 2022-01-24 ENCOUNTER — Ambulatory Visit: Admit: 2022-01-24 | Discharge: 2022-01-25 | Payer: PRIVATE HEALTH INSURANCE

## 2022-01-24 DIAGNOSIS — M5432 Sciatica, left side: Principal | ICD-10-CM

## 2022-01-24 DIAGNOSIS — M7918 Myalgia, other site: Principal | ICD-10-CM

## 2022-01-24 DIAGNOSIS — M791 Myalgia, unspecified site: Principal | ICD-10-CM

## 2022-01-24 DIAGNOSIS — M545 Acute exacerbation of chronic low back pain: Principal | ICD-10-CM

## 2022-01-24 DIAGNOSIS — G8929 Other chronic pain: Principal | ICD-10-CM

## 2022-01-27 ENCOUNTER — Ambulatory Visit
Admit: 2022-01-27 | Payer: PRIVATE HEALTH INSURANCE | Attending: Rehabilitative and Restorative Service Providers" | Primary: Rehabilitative and Restorative Service Providers"

## 2022-02-15 ENCOUNTER — Institutional Professional Consult (permissible substitution): Payer: BLUE CROSS/BLUE SHIELD | Admitting: Pulmonary Disease

## 2022-05-12 ENCOUNTER — Ambulatory Visit
Admit: 2022-05-12 | Discharge: 2022-05-13 | Payer: PRIVATE HEALTH INSURANCE | Attending: Rehabilitative and Restorative Service Providers" | Primary: Rehabilitative and Restorative Service Providers"

## 2022-07-28 ENCOUNTER — Ambulatory Visit: Admit: 2022-07-28 | Discharge: 2022-07-29 | Payer: BLUE CROSS/BLUE SHIELD

## 2022-07-28 DIAGNOSIS — R0609 Other forms of dyspnea: Principal | ICD-10-CM

## 2022-07-28 DIAGNOSIS — I10 Essential (primary) hypertension: Principal | ICD-10-CM

## 2022-07-28 DIAGNOSIS — I739 Peripheral vascular disease, unspecified: Principal | ICD-10-CM

## 2022-07-28 MED ORDER — METOPROLOL SUCCINATE ER 25 MG TABLET,EXTENDED RELEASE 24 HR
ORAL_TABLET | Freq: Every day | ORAL | 1 refills | 180 days | Status: CP
Start: 2022-07-28 — End: ?

## 2022-08-04 ENCOUNTER — Other Ambulatory Visit: Payer: Self-pay | Admitting: Family Medicine

## 2022-08-04 DIAGNOSIS — Z1231 Encounter for screening mammogram for malignant neoplasm of breast: Secondary | ICD-10-CM

## 2022-08-05 DIAGNOSIS — R0602 Shortness of breath: Principal | ICD-10-CM

## 2022-08-08 ENCOUNTER — Ambulatory Visit (INDEPENDENT_AMBULATORY_CARE_PROVIDER_SITE_OTHER): Payer: Medicaid Other | Admitting: Internal Medicine

## 2022-08-08 VITALS — BP 136/95 | HR 52 | Resp 16 | Ht 68.0 in | Wt 172.0 lb

## 2022-08-08 DIAGNOSIS — I10 Essential (primary) hypertension: Secondary | ICD-10-CM | POA: Diagnosis not present

## 2022-08-08 DIAGNOSIS — G4733 Obstructive sleep apnea (adult) (pediatric): Secondary | ICD-10-CM | POA: Diagnosis not present

## 2022-08-08 NOTE — Progress Notes (Signed)
Sleep Medicine   Office Visit  Patient Name: Karen Ewing DOB: [birth year] MRN [id number]    Chief Complaint: sleep evaluation  Brief History:  Karen Ewing presents with a 1 year history of loud snoring. This is worse in the supine position.   Sleep quality is poor. This is noted most nights. The patient's bed partner reports  loud snoring and witnessed apnea at night. The patient relates the following symptoms: Loud snoring, choking, excessive daytime sleepiness, morning headaches and she is aware of her surroundings while asleep are also present. The patient has no set time when she goes to sleep or wakes up.  Sleep quality is same when outside home environment.  Patient has noted restlessness of her legs at night.  The patient  relates no unusual behavior during the night.  The patient denies a history of psychiatric problems. The Epworth Sleepiness Score is 11 out of 24 .  The patient relates  Cardiovascular risk factors include: hypertension.      ROS  General: (-) fever, (-) chills, (-) night sweat Nose and Sinuses: (-) nasal stuffiness or itchiness, (-) postnasal drip, (-) nosebleeds, (-) sinus trouble. Mouth and Throat: (-) sore throat, (-) hoarseness. Neck: (-) swollen glands, (-) enlarged thyroid, (-) neck pain. Respiratory: + cough, +shortness of breath, + wheezing (has asthma and gerd) Neurologic: - numbness, - tingling. Psychiatric: - anxiety, - depression Sleep behavior: -sleep paralysis -hypnogogic hallucinations -dream enactment      -vivid dreams -cataplexy -night terrors -sleep walking   Current Medication: Outpatient Encounter Medications as of 08/08/2022  Medication Sig   albuterol (VENTOLIN HFA) 108 (90 Base) MCG/ACT inhaler Inhale into the lungs.   amoxicillin (AMOXIL) 500 MG tablet Take 500 mg by mouth every 8 (eight) hours.   cetirizine (ZYRTEC) 10 MG tablet Take 10 mg by mouth daily.   DULoxetine (CYMBALTA) 30 MG capsule Take 30 mg by mouth daily.    fluticasone (FLONASE) 50 MCG/ACT nasal spray    losartan-hydrochlorothiazide (HYZAAR) 100-25 MG tablet    metoprolol succinate (TOPROL-XL) 25 MG 24 hr tablet Take 1 tablet by mouth daily.   tiZANidine (ZANAFLEX) 2 MG tablet Take by mouth.   atorvastatin (LIPITOR) 20 MG tablet Take 20 mg by mouth daily.   levothyroxine (SYNTHROID) 88 MCG tablet Take 88 mcg by mouth daily.   omeprazole (PRILOSEC) 40 MG capsule Take 40 mg by mouth daily.   [DISCONTINUED] albuterol (VENTOLIN HFA) 108 (90 Base) MCG/ACT inhaler Inhale 2-4 puffs by mouth every 4 hours as needed for wheezing, cough, and/or shortness of breath   [DISCONTINUED] amoxicillin (AMOXIL) 500 MG capsule Take 1 capsule (500 mg total) by mouth 3 (three) times daily.   [DISCONTINUED] benzonatate (TESSALON PERLES) 100 MG capsule Take 1 capsule (100 mg total) by mouth 3 (three) times daily as needed for cough.   [DISCONTINUED] cyclobenzaprine (FLEXERIL) 10 MG tablet Take 1 tablet (10 mg total) by mouth 3 (three) times daily as needed.   [DISCONTINUED] HYDROcodone-Chlorpheniramine 5-4 MG/5ML SOLN Take 5 mLs by mouth every 6 (six) hours as needed.   [DISCONTINUED] ibuprofen (ADVIL) 600 MG tablet Take 1 tablet (600 mg total) by mouth every 8 (eight) hours as needed.   [DISCONTINUED] predniSONE (DELTASONE) 20 MG tablet Take 3 tabs daily x 2 days; Take 2 tabs daily x 3 days; Take 1 tab daily x 3 days; Take 0.5 tabs daily x 4 days   No facility-administered encounter medications on file as of 08/08/2022.    Surgical History: Past Surgical History:  Procedure Laterality Date   APPENDECTOMY     THYROID SURGERY      Medical History: Past Medical History:  Diagnosis Date   Arthritis    Asthma    Hypertension    Thyroid disease     Family History: Non contributory to the present illness  Social History: Social History   Socioeconomic History   Marital status: Single    Spouse name: Not on file   Number of children: Not on file   Years of  education: Not on file   Highest education level: Not on file  Occupational History   Not on file  Tobacco Use   Smoking status: Former   Smokeless tobacco: Never  Substance and Sexual Activity   Alcohol use: Yes    Comment: occas.    Drug use: Not Currently   Sexual activity: Not on file  Other Topics Concern   Not on file  Social History Narrative   Not on file   Social Determinants of Health   Financial Resource Strain: Not on file  Food Insecurity: Not on file  Transportation Needs: Not on file  Physical Activity: Not on file  Stress: Not on file  Social Connections: Not on file  Intimate Partner Violence: Not on file    Vital Signs: Blood pressure (!) 136/95, pulse (!) 52, resp. rate 16, height '5\' 8"'$  (1.727 m), weight 172 lb (78 kg). Body mass index is 26.15 kg/m.   Examination: General Appearance: The patient is well-developed, well-nourished, and in no distress. Neck Circumference: 37 cm Skin: Gross inspection of skin unremarkable. Head: normocephalic, no gross deformities. Eyes: no gross deformities noted. ENT: ears appear grossly normal Neurologic: Alert and oriented. No involuntary movements.    STOP BANG RISK ASSESSMENT S (snore) Have you been told that you snore?     YES   T (tired) Are you often tired, fatigued, or sleepy during the day?   YES  O (obstruction) Do you stop breathing, choke, or gasp during sleep? YES   P (pressure) Do you have or are you being treated for high blood pressure? YES   B (BMI) Is your body index greater than 35 kg/m? NO   A (age) Are you 58 years old or older? YES   N (neck) Do you have a neck circumference greater than 16 inches?   NO   G (gender) Are you a female? NO   TOTAL STOP/BANG "YES" ANSWERS 5                                                               A STOP-Bang score of 2 or less is considered low risk, and a score of 5 or more is high risk for having either moderate or severe OSA. For people who  score 3 or 4, doctors may need to perform further assessment to determine how likely they are to have OSA.         EPWORTH SLEEPINESS SCALE:  Scale:  (0)= no chance of dozing; (1)= slight chance of dozing; (2)= moderate chance of dozing; (3)= high chance of dozing  Chance  Situtation    Sitting and reading: 2    Watching TV: 3    Sitting Inactive in public: 0    As a passenger  in car: 1      Lying down to rest: 2    Sitting and talking: 0    Sitting quielty after lunch: 3    In a car, stopped in traffic: 0   TOTAL SCORE:   11 out of 24    SLEEP STUDIES:  No prior sleep study   LABS: No results found for this or any previous visit (from the past 2160 hour(s)).  Radiology: MM 3D SCREEN BREAST BILATERAL  Result Date: 08/20/2021 CLINICAL DATA:  Screening. EXAM: DIGITAL SCREENING BILATERAL MAMMOGRAM WITH TOMOSYNTHESIS AND CAD TECHNIQUE: Bilateral screening digital craniocaudal and mediolateral oblique mammograms were obtained. Bilateral screening digital breast tomosynthesis was performed. The images were evaluated with computer-aided detection. COMPARISON:  Previous exam 08/31/2018 from [place], [place]. Awaiting the prior outside mammogram accounts for the delay in this report. ACR Breast Density Category c: The breast tissue is heterogeneously dense, which may obscure small masses. FINDINGS: There are no findings suspicious for malignancy. IMPRESSION: No mammographic evidence of malignancy. A result letter of this screening mammogram will be mailed directly to the patient. RECOMMENDATION: Screening mammogram in one year. (Code:SM-B-01Y) BI-RADS CATEGORY  1: Negative. Electronically Signed   By: Evangeline Dakin M.D.   On: 08/20/2021 09:48    No results found.  No results found.    Assessment and Plan: Patient Active Problem List   Diagnosis Date Noted   OSA (obstructive sleep apnea) 08/08/2022   Primary hypertension 01/04/2021   1. OSA (obstructive sleep  apnea) PLAN OSA:   Patient evaluation suggests high risk of sleep disordered breathing due to witnessed apnea, choking, gasping, snoring, morning headaches.  Patient has comorbid cardiovascular risk factors including: hypertension which could be exacerbated by pathologic sleep-disordered breathing.  Suggest: PSG to assess/treat the patient's sleep disordered breathing. The patient was also counselled on weight loss to optimize sleep health.  2. Primary hypertension Hypertension Counseling:   The following hypertensive lifestyle modification were recommended and discussed:  1. Limiting alcohol intake to less than 1 oz/day of ethanol:(24 oz of beer or 8 oz of wine or 2 oz of 100-proof whiskey). 2. Take baby ASA 81 mg daily. 3. Importance of regular aerobic exercise and losing weight. 4. Reduce dietary saturated fat and cholesterol intake for overall cardiovascular health. 5. Maintaining adequate dietary potassium, calcium, and magnesium intake. 6. Regular monitoring of the blood pressure. 7. Reduce sodium intake to less than 100 mmol/day (less than 2.3 gm of sodium or less than 6 gm of sodium choride)      General Counseling: I have discussed the findings of the evaluation and examination with Karen Ewing.  I have also discussed any further diagnostic evaluation thatmay be needed or ordered today. Karen Ewing of the findings of todays visit. We also reviewed her medications today and discussed drug interactions and side effects including but not limited excessive drowsiness and altered mental states. We also discussed that there is always a risk not just to her but also people around her. she has been encouraged to call the office with any questions or concerns that should arise related to todays visit.  No orders of the defined types were placed in this encounter.       I have personally obtained a history, evaluated the patient, evaluated pertinent data, formulated the  assessment and plan and placed orders.  This patient was seen today by Tressie Ellis, PA-C in collaboration with Dr. Devona Konig.    Allyne Gee, MD [facility] Diplomate ABMS  Pulmonary and Critical Care Medicine Sleep medicine

## 2022-08-10 ENCOUNTER — Ambulatory Visit
Admit: 2022-08-10 | Discharge: 2022-08-10 | Payer: BLUE CROSS/BLUE SHIELD | Attending: Internal Medicine | Primary: Internal Medicine

## 2022-08-10 ENCOUNTER — Ambulatory Visit: Admit: 2022-08-10 | Discharge: 2022-08-10 | Payer: BLUE CROSS/BLUE SHIELD

## 2022-08-10 DIAGNOSIS — R0602 Shortness of breath: Principal | ICD-10-CM

## 2022-08-10 DIAGNOSIS — J4530 Mild persistent asthma, uncomplicated: Principal | ICD-10-CM

## 2022-08-10 DIAGNOSIS — Z23 Encounter for immunization: Principal | ICD-10-CM

## 2022-08-10 MED ORDER — MOMETASONE-FORMOTEROL HFA 200 MCG-5 MCG/ACTUATION AEROSOL INHALER
Freq: Two times a day (BID) | RESPIRATORY_TRACT | 11 refills | 89 days | Status: CP
Start: 2022-08-10 — End: ?

## 2022-09-01 ENCOUNTER — Ambulatory Visit: Admit: 2022-09-01 | Discharge: 2022-09-02 | Payer: BLUE CROSS/BLUE SHIELD

## 2022-09-01 DIAGNOSIS — M5412 Radiculopathy, cervical region: Principal | ICD-10-CM

## 2022-09-01 DIAGNOSIS — G959 Disease of spinal cord, unspecified: Principal | ICD-10-CM

## 2022-09-01 DIAGNOSIS — M5416 Radiculopathy, lumbar region: Principal | ICD-10-CM

## 2022-09-12 ENCOUNTER — Ambulatory Visit: Admit: 2022-09-12 | Discharge: 2022-09-13 | Payer: BLUE CROSS/BLUE SHIELD

## 2022-09-12 DIAGNOSIS — R0609 Other forms of dyspnea: Principal | ICD-10-CM

## 2022-09-12 DIAGNOSIS — I739 Peripheral vascular disease, unspecified: Principal | ICD-10-CM

## 2022-09-12 DIAGNOSIS — I10 Essential (primary) hypertension: Principal | ICD-10-CM

## 2022-09-14 ENCOUNTER — Ambulatory Visit: Admit: 2022-09-14 | Discharge: 2022-09-14 | Payer: BLUE CROSS/BLUE SHIELD

## 2022-09-16 ENCOUNTER — Ambulatory Visit
Admit: 2022-09-16 | Discharge: 2022-09-16 | Disposition: A | Discharge status: Home / Self Care | Payer: BLUE CROSS/BLUE SHIELD

## 2022-09-16 ENCOUNTER — Emergency Department
Admit: 2022-09-16 | Discharge: 2022-09-16 | Disposition: A | Discharge status: Home / Self Care | Payer: BLUE CROSS/BLUE SHIELD

## 2022-09-16 DIAGNOSIS — J329 Chronic sinusitis, unspecified: Principal | ICD-10-CM

## 2022-09-16 MED ORDER — AMOXICILLIN 875 MG-POTASSIUM CLAVULANATE 125 MG TABLET
ORAL_TABLET | Freq: Two times a day (BID) | ORAL | 0 refills | 10 days | Status: CP
Start: 2022-09-16 — End: 2022-09-26

## 2022-09-19 ENCOUNTER — Ambulatory Visit: Admit: 2022-09-19 | Discharge: 2022-09-20 | Payer: BLUE CROSS/BLUE SHIELD

## 2022-09-19 DIAGNOSIS — M5416 Radiculopathy, lumbar region: Principal | ICD-10-CM

## 2022-09-19 DIAGNOSIS — G959 Disease of spinal cord, unspecified: Principal | ICD-10-CM

## 2022-09-29 ENCOUNTER — Ambulatory Visit: Admit: 2022-09-29 | Discharge: 2022-09-30 | Payer: BLUE CROSS/BLUE SHIELD

## 2022-09-29 DIAGNOSIS — M5416 Radiculopathy, lumbar region: Principal | ICD-10-CM

## 2022-10-12 ENCOUNTER — Ambulatory Visit: Admit: 2022-10-12 | Payer: BLUE CROSS/BLUE SHIELD

## 2022-10-20 ENCOUNTER — Ambulatory Visit: Admit: 2022-10-20 | Discharge: 2022-10-20 | Payer: BLUE CROSS/BLUE SHIELD

## 2022-10-20 DIAGNOSIS — Z113 Encounter for screening for infections with a predominantly sexual mode of transmission: Principal | ICD-10-CM

## 2022-10-20 DIAGNOSIS — N761 Subacute and chronic vaginitis: Principal | ICD-10-CM

## 2022-10-20 DIAGNOSIS — N958 Other specified menopausal and perimenopausal disorders: Principal | ICD-10-CM

## 2022-10-20 DIAGNOSIS — Z01419 Encounter for gynecological examination (general) (routine) without abnormal findings: Principal | ICD-10-CM

## 2022-10-20 DIAGNOSIS — N951 Menopausal and female climacteric states: Principal | ICD-10-CM

## 2022-10-20 DIAGNOSIS — N393 Stress incontinence (female) (male): Principal | ICD-10-CM

## 2022-10-20 MED ORDER — METRONIDAZOLE 500 MG TABLET
ORAL_TABLET | Freq: Two times a day (BID) | ORAL | 0 refills | 7 days | Status: CP
Start: 2022-10-20 — End: 2022-10-27

## 2022-11-14 DIAGNOSIS — R918 Other nonspecific abnormal finding of lung field: Principal | ICD-10-CM

## 2022-11-16 ENCOUNTER — Ambulatory Visit
Admit: 2022-11-16 | Discharge: 2022-11-17 | Payer: BLUE CROSS/BLUE SHIELD | Attending: Internal Medicine | Primary: Internal Medicine

## 2022-11-16 DIAGNOSIS — R058 Productive cough: Principal | ICD-10-CM

## 2022-11-17 ENCOUNTER — Ambulatory Visit: Admit: 2022-11-17 | Discharge: 2022-11-17 | Payer: BLUE CROSS/BLUE SHIELD

## 2022-11-17 ENCOUNTER — Ambulatory Visit
Admit: 2022-11-17 | Discharge: 2022-11-17 | Payer: BLUE CROSS/BLUE SHIELD | Attending: Student in an Organized Health Care Education/Training Program | Primary: Student in an Organized Health Care Education/Training Program

## 2022-11-17 DIAGNOSIS — R058 Productive cough: Principal | ICD-10-CM

## 2022-11-17 DIAGNOSIS — K638219 Small intestinal bacterial overgrowth (SIBO): Principal | ICD-10-CM

## 2022-11-17 DIAGNOSIS — K31A Gastric intestinal metaplasia: Principal | ICD-10-CM

## 2022-11-17 MED ORDER — METRONIDAZOLE 500 MG TABLET
ORAL_TABLET | Freq: Three times a day (TID) | ORAL | 0 refills | 14 days | Status: CP
Start: 2022-11-17 — End: 2022-12-01

## 2022-11-21 ENCOUNTER — Ambulatory Visit: Admit: 2022-11-21 | Discharge: 2022-11-22 | Payer: BLUE CROSS/BLUE SHIELD

## 2022-11-21 MED ORDER — GABAPENTIN 300 MG CAPSULE
ORAL_CAPSULE | Freq: Three times a day (TID) | ORAL | 2 refills | 30 days | Status: CP
Start: 2022-11-21 — End: 2023-02-19

## 2022-11-23 ENCOUNTER — Ambulatory Visit: Admit: 2022-11-23 | Discharge: 2022-12-10 | Payer: BLUE CROSS/BLUE SHIELD

## 2022-11-23 ENCOUNTER — Ambulatory Visit: Admit: 2022-11-23 | Payer: BLUE CROSS/BLUE SHIELD

## 2022-11-23 ENCOUNTER — Ambulatory Visit
Admit: 2022-11-23 | Discharge: 2022-12-10 | Payer: BLUE CROSS/BLUE SHIELD | Attending: Rehabilitative and Restorative Service Providers" | Primary: Rehabilitative and Restorative Service Providers"

## 2022-11-24 ENCOUNTER — Ambulatory Visit: Admit: 2022-11-24 | Discharge: 2022-11-25 | Payer: BLUE CROSS/BLUE SHIELD

## 2022-11-30 DIAGNOSIS — M5030 Other cervical disc degeneration, unspecified cervical region: Principal | ICD-10-CM

## 2022-12-01 ENCOUNTER — Ambulatory Visit: Admit: 2022-12-01 | Discharge: 2022-12-01 | Payer: BLUE CROSS/BLUE SHIELD

## 2022-12-01 ENCOUNTER — Ambulatory Visit: Admit: 2022-12-01 | Discharge: 2022-12-02 | Payer: BLUE CROSS/BLUE SHIELD

## 2022-12-05 ENCOUNTER — Ambulatory Visit: Admit: 2022-12-05 | Discharge: 2022-12-06 | Payer: BLUE CROSS/BLUE SHIELD

## 2022-12-20 ENCOUNTER — Ambulatory Visit: Admit: 2022-12-20 | Payer: BLUE CROSS/BLUE SHIELD

## 2023-01-12 ENCOUNTER — Ambulatory Visit
Admit: 2023-01-12 | Discharge: 2023-02-08 | Payer: BLUE CROSS/BLUE SHIELD | Attending: Rehabilitative and Restorative Service Providers" | Primary: Rehabilitative and Restorative Service Providers"

## 2023-01-12 ENCOUNTER — Ambulatory Visit: Admit: 2023-01-12 | Payer: BLUE CROSS/BLUE SHIELD

## 2023-01-12 ENCOUNTER — Ambulatory Visit: Admit: 2023-01-10 | Payer: BLUE CROSS/BLUE SHIELD

## 2023-01-26 ENCOUNTER — Ambulatory Visit: Admit: 2023-01-26 | Discharge: 2023-01-27 | Payer: BLUE CROSS/BLUE SHIELD

## 2023-01-26 MED ORDER — METOPROLOL SUCCINATE ER 25 MG TABLET,EXTENDED RELEASE 24 HR
ORAL_TABLET | Freq: Every day | ORAL | 1 refills | 360 days | Status: CP
Start: 2023-01-26 — End: ?

## 2023-01-27 ENCOUNTER — Ambulatory Visit: Admit: 2023-01-27 | Payer: BLUE CROSS/BLUE SHIELD

## 2023-02-10 ENCOUNTER — Ambulatory Visit: Admit: 2023-02-10 | Discharge: 2023-02-10 | Payer: BLUE CROSS/BLUE SHIELD

## 2023-02-10 DIAGNOSIS — M5416 Radiculopathy, lumbar region: Principal | ICD-10-CM

## 2023-02-10 DIAGNOSIS — M5412 Radiculopathy, cervical region: Principal | ICD-10-CM

## 2023-02-10 MED ORDER — GABAPENTIN 100 MG CAPSULE
ORAL_CAPSULE | Freq: Three times a day (TID) | ORAL | 2 refills | 30 days | Status: CP
Start: 2023-02-10 — End: ?

## 2023-03-01 ENCOUNTER — Ambulatory Visit: Admit: 2023-03-01 | Discharge: 2023-03-02 | Payer: BLUE CROSS/BLUE SHIELD

## 2023-04-17 ENCOUNTER — Ambulatory Visit: Admit: 2023-04-17 | Discharge: 2023-04-17 | Payer: BLUE CROSS/BLUE SHIELD

## 2023-04-17 ENCOUNTER — Ambulatory Visit
Admit: 2023-04-17 | Discharge: 2023-04-17 | Payer: BLUE CROSS/BLUE SHIELD | Attending: Internal Medicine | Primary: Internal Medicine

## 2023-04-17 DIAGNOSIS — R0602 Shortness of breath: Principal | ICD-10-CM

## 2023-04-17 MED ORDER — MOMETASONE-FORMOTEROL HFA 200 MCG-5 MCG/ACTUATION AEROSOL INHALER
Freq: Two times a day (BID) | RESPIRATORY_TRACT | 11 refills | 90 days | Status: CP
Start: 2023-04-17 — End: ?

## 2023-04-25 ENCOUNTER — Ambulatory Visit: Admit: 2023-04-25 | Payer: BLUE CROSS/BLUE SHIELD

## 2023-05-03 ENCOUNTER — Ambulatory Visit: Admit: 2023-05-03 | Payer: BLUE CROSS/BLUE SHIELD

## 2023-06-28 ENCOUNTER — Ambulatory Visit: Admit: 2023-06-28 | Payer: BLUE CROSS/BLUE SHIELD

## 2023-07-13 IMAGING — MG MM DIGITAL SCREENING BILAT W/ TOMO AND CAD
8 series · 8 of 24 positions shown · non-contrast
Comparison: Previous exam 08/31/2018 from [place], [place].

CLINICAL DATA: Screening.

EXAM:
DIGITAL SCREENING BILATERAL MAMMOGRAM WITH TOMOSYNTHESIS AND CAD
TECHNIQUE: Bilateral screening digital craniocaudal and mediolateral oblique
mammograms were obtained. Bilateral screening digital breast
tomosynthesis was performed. The images were evaluated with
computer-aided detection.

[L MLO synth-2D]
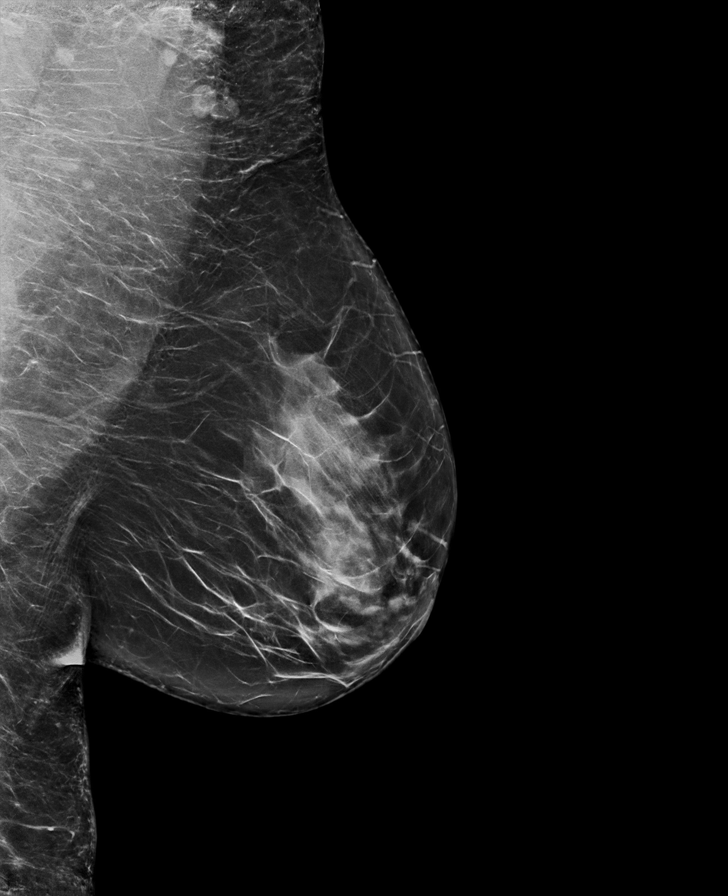

[R CC synth-2D]
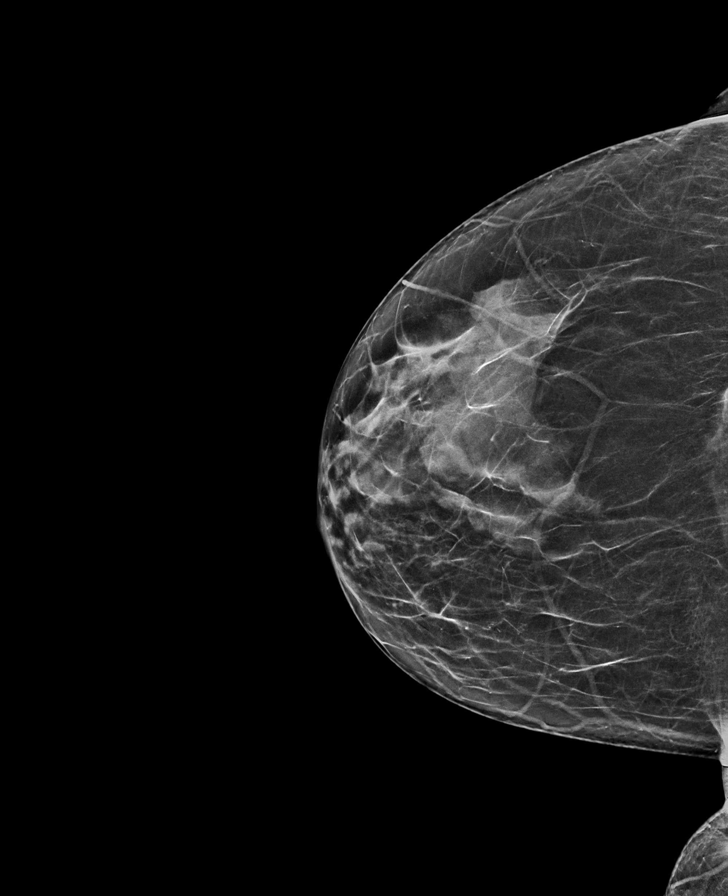

[R MLO synth-2D]
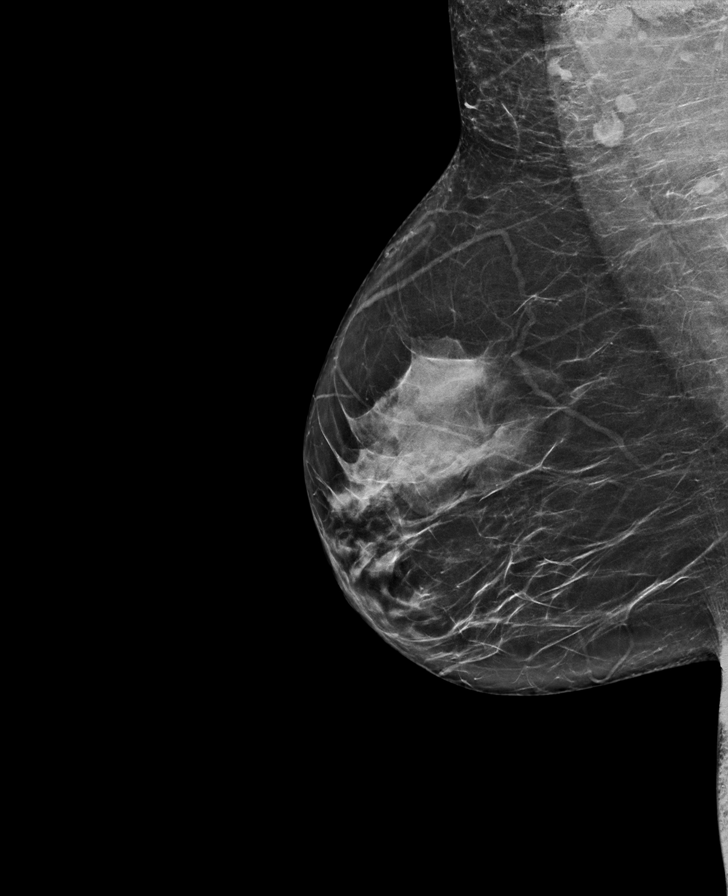

[L CC synth-2D]
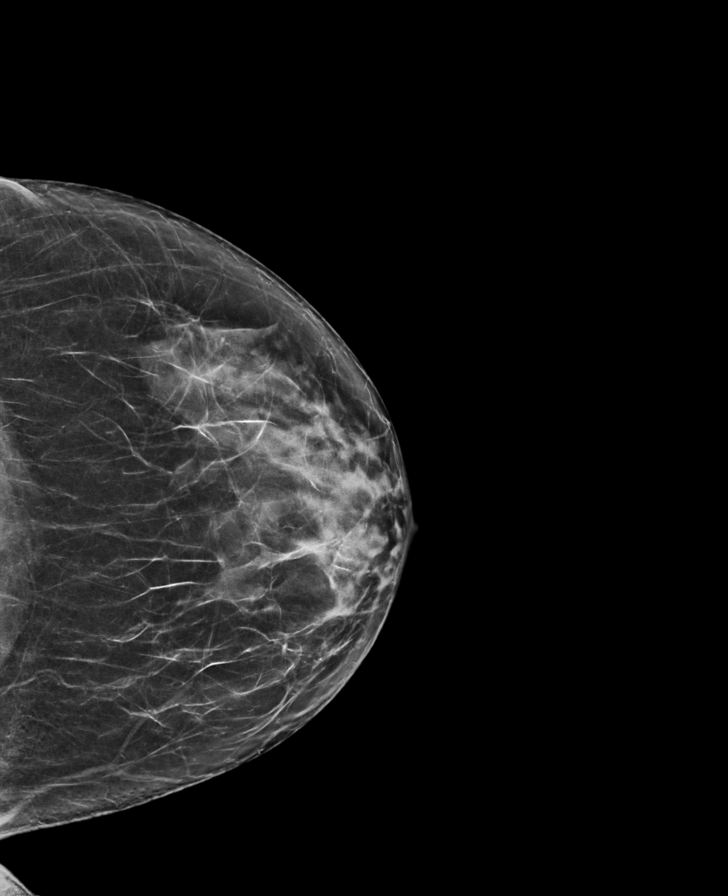

[R MLO tomo · tomo slice 37/72.0]
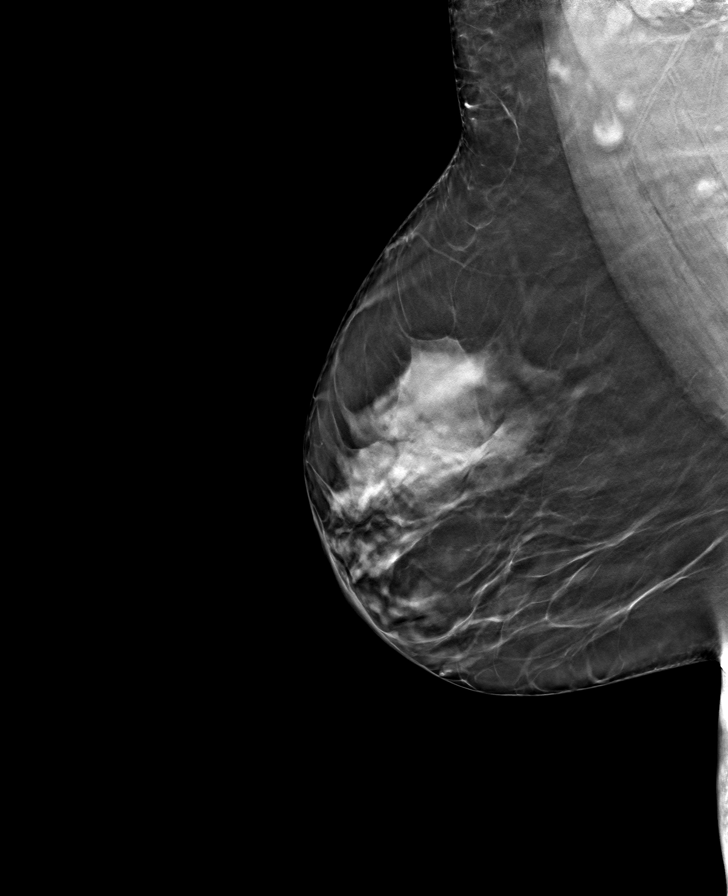

[R CC tomo · tomo slice 32/63.0]
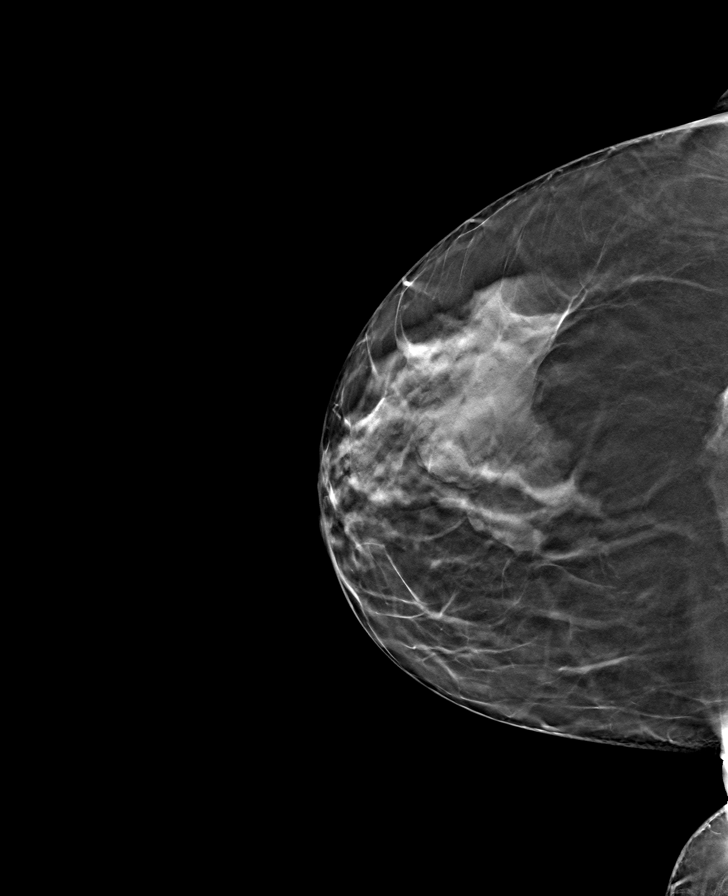

[L MLO tomo · tomo slice 41/82.0]
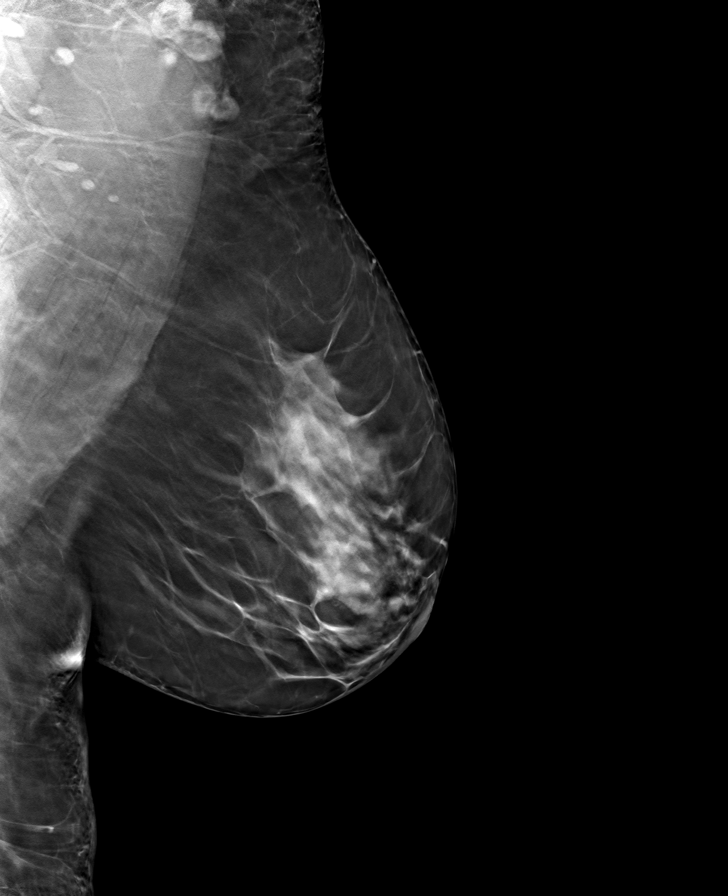

[L CC tomo · tomo slice 33/65.0]
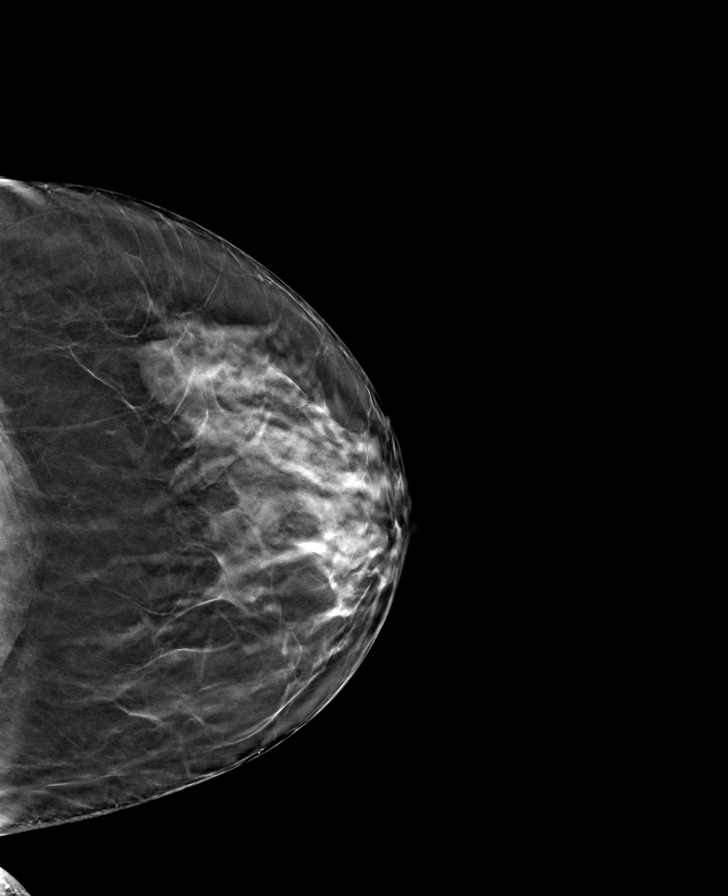

[8 of 24 positions shown; findings below may reference images not displayed]

Awaiting the prior outside mammogram accounts for the delay in this
report.

ACR Breast Density Category c: The breast tissue is heterogeneously
dense, which may obscure small masses.
FINDINGS: There are no findings suspicious for malignancy.
IMPRESSION: No mammographic evidence of malignancy. A result letter of this
screening mammogram will be mailed directly to the patient.

RECOMMENDATION:
Screening mammogram in one year. (Code:OI-W-RYR)

BI-RADS CATEGORY  1: Negative.

## 2023-07-20 ENCOUNTER — Ambulatory Visit: Admit: 2023-07-20 | Discharge: 2023-07-21 | Payer: MEDICAID

## 2023-07-27 ENCOUNTER — Emergency Department
Admit: 2023-07-27 | Discharge: 2023-07-28 | Disposition: A | Discharge status: Home / Self Care | Payer: MEDICAID | Attending: Student in an Organized Health Care Education/Training Program

## 2023-07-27 DIAGNOSIS — L282 Other prurigo: Principal | ICD-10-CM

## 2023-07-27 DIAGNOSIS — M545 Acute left-sided low back pain without sciatica: Principal | ICD-10-CM

## 2023-07-27 MED ORDER — TRIAMCINOLONE ACETONIDE 0.1 % TOPICAL CREAM
Freq: Two times a day (BID) | TOPICAL | 0 refills | 30.00 days | Status: CP
Start: 2023-07-27 — End: 2023-08-26

## 2023-09-07 ENCOUNTER — Encounter: Admit: 2023-09-07 | Discharge: 2023-09-07 | Payer: MEDICARE

## 2023-09-07 ENCOUNTER — Inpatient Hospital Stay: Admit: 2023-09-07 | Discharge: 2023-09-07 | Payer: MEDICARE

## 2023-09-11 ENCOUNTER — Ambulatory Visit: Admit: 2023-09-11 | Discharge: 2023-09-12 | Attending: Internal Medicine | Primary: Internal Medicine

## 2023-10-13 NOTE — Progress Notes (Unsigned)
 Sleep Medicine   Office Visit  Patient Name: Karen Ewing DOB: Apr 22, 1966 MRN [id number]    Chief Complaint: ***  Brief History:  Karen Ewing presents an annual follow up visit to re-evaluate sleep. Patient has a 1 year history of sleep apnea but has not been on a PAP yet. Sleep quality is poor. This is noted most night. The patient's bed partner reports loud snoring and witnessed apneic pauses at night. The patient relates the following symptoms: snoring, choking, excessive daytime sleepiness, headaches, and awareness of her surroundings while she is asleep are also present. The patient has no set time when she goes to sleep and when she wakes up.  Sleep quality is same  when outside home environment.  Patient has noted restlessness of her legs at night that would disrupt her sleep.  The patient  relates no unusual behavior during the night.  The patient relates  no history of psychiatric problems. The Epworth Sleepiness Score is *** out of 24 .  The patient relates  Cardiovascular risk factors include: HTN. The patient reports ***    ROS  General: (-) fever, (-) chills, (-) night sweat Nose and Sinuses: (-) nasal stuffiness or itchiness, (-) postnasal drip, (-) nosebleeds, (-) sinus trouble. Mouth and Throat: (-) sore throat, (-) hoarseness. Neck: (-) swollen glands, (-) enlarged thyroid , (-) neck pain. Respiratory: *** cough, *** shortness of breath, *** wheezing. Neurologic: *** numbness, *** tingling. Psychiatric: *** anxiety, *** depression Sleep behavior: ***sleep paralysis ***hypnogogic hallucinations ***dream enactment      ***vivid dreams ***cataplexy ***night terrors ***sleep walking   Current Medication: Outpatient Encounter Medications as of 10/16/2023  Medication Sig   albuterol  (VENTOLIN  HFA) 108 (90 Base) MCG/ACT inhaler Inhale into the lungs.   amoxicillin  (AMOXIL ) 500 MG tablet Take 500 mg by mouth every 8 (eight) hours.   atorvastatin (LIPITOR) 20 MG tablet Take 20  mg by mouth daily.   cetirizine (ZYRTEC) 10 MG tablet Take 10 mg by mouth daily.   DULoxetine (CYMBALTA) 30 MG capsule Take 30 mg by mouth daily.   fluticasone (FLONASE) 50 MCG/ACT nasal spray    levothyroxine (SYNTHROID) 88 MCG tablet Take 88 mcg by mouth daily.   losartan-hydrochlorothiazide (HYZAAR) 100-25 MG tablet    metoprolol succinate (TOPROL-XL) 25 MG 24 hr tablet Take 1 tablet by mouth daily.   omeprazole (PRILOSEC) 40 MG capsule Take 40 mg by mouth daily.   tiZANidine (ZANAFLEX) 2 MG tablet Take by mouth.   No facility-administered encounter medications on file as of 10/16/2023.    Surgical History: Past Surgical History:  Procedure Laterality Date   APPENDECTOMY     THYROID  SURGERY      Medical History: Past Medical History:  Diagnosis Date   Arthritis    Asthma    Hypertension    Thyroid  disease     Family History: Non contributory to the present illness  Social History: Social History   Socioeconomic History   Marital status: Single    Spouse name: Not on file   Number of children: Not on file   Years of education: Not on file   Highest education level: Not on file  Occupational History   Not on file  Tobacco Use   Smoking status: Former   Smokeless tobacco: Never  Substance and Sexual Activity   Alcohol use: Yes    Comment: occas.    Drug use: Not Currently   Sexual activity: Not on file  Other Topics Concern   Not on file  Social History Narrative   Not on file   Social Drivers of Health   Financial Resource Strain: Not on file  Food Insecurity: Not on file  Transportation Needs: Not on file  Physical Activity: Not on file  Stress: Not on file  Social Connections: Not on file  Intimate Partner Violence: Not on file    Vital Signs: There were no vitals taken for this visit. There is no height or weight on file to calculate BMI.   Examination: General Appearance: The patient is well-developed, well-nourished, and in no  distress. Neck Circumference: 37 cm Skin: Gross inspection of skin unremarkable. Head: normocephalic, no gross deformities. Eyes: no gross deformities noted. ENT: ears appear grossly normal Neurologic: Alert and oriented. No involuntary movements.    STOP BANG RISK ASSESSMENT S (snore) Have you been told that you snore?     YES   T (tired) Are you often tired, fatigued, or sleepy during the day?   YES  O (obstruction) Do you stop breathing, choke, or gasp during sleep? YES   P (pressure) Do you have or are you being treated for high blood pressure? YES   B (BMI) Is your body index greater than 35 kg/m? NO   A (age) Are you 59 years old or older? NO   N (neck) Do you have a neck circumference greater than 16 inches?   NO   G (gender) Are you a female? NO   TOTAL STOP/BANG "YES" ANSWERS                                                                A STOP-Bang score of 2 or less is considered low risk, and a score of 5 or more is high risk for having either moderate or severe OSA. For people who score 3 or 4, doctors may need to perform further assessment to determine how likely they are to have OSA.         EPWORTH SLEEPINESS SCALE:  Scale:  (0)= no chance of dozing; (1)= slight chance of dozing; (2)= moderate chance of dozing; (3)= high chance of dozing  Chance  Situtation    Sitting and reading: ***    Watching TV: ***    Sitting Inactive in public: ***    As a passenger in car: ***      Lying down to rest: ***    Sitting and talking: ***    Sitting quielty after lunch: ***    In a car, stopped in traffic: ***   TOTAL SCORE:   *** out of 24    SLEEP STUDIES:  HST (09/2022) AHI 8.8/hr, min SpO2 85%.   LABS: No results found for this or any previous visit (from the past 2160 hours).  Radiology: MM 3D SCREEN BREAST BILATERAL Result Date: 08/20/2021 CLINICAL DATA:  Screening. EXAM: DIGITAL SCREENING BILATERAL MAMMOGRAM WITH TOMOSYNTHESIS AND CAD  TECHNIQUE: Bilateral screening digital craniocaudal and mediolateral oblique mammograms were obtained. Bilateral screening digital breast tomosynthesis was performed. The images were evaluated with computer-aided detection. COMPARISON:  Previous exam 08/31/2018 from [place], [place] . Awaiting the prior outside mammogram accounts for the delay in this report. ACR Breast Density Category c: The breast tissue is heterogeneously dense, which may obscure small masses. FINDINGS: There are  no findings suspicious for malignancy. IMPRESSION: No mammographic evidence of malignancy. A result letter of this screening mammogram will be mailed directly to the patient. RECOMMENDATION: Screening mammogram in one year. (Code:SM-B-01Y) BI-RADS CATEGORY  1: Negative. Electronically Signed   By: Rinda Cheers M.D.   On: 08/20/2021 09:48    No results found.  No results found.    Assessment and Plan: Patient Active Problem List   Diagnosis Date Noted   OSA (obstructive sleep apnea) 08/08/2022   Primary hypertension 01/04/2021     PLAN OSA:   Patient evaluation suggests high risk of sleep disordered breathing due to *** Patient has comorbid cardiovascular risk factors including: *** which could be exacerbated by pathologic sleep-disordered breathing.  Suggest: *** to assess/treat the patient's sleep disordered breathing. The patient was also counselled on *** to optimize sleep health.  PLAN hypersomnia:  Patient evaluation suggests significant daytime hypersomnia.  The Epworth Sleepiness Score is elevated at *** out of 24. Patient *** drowsy driving. The patient *** MVA due to sleepiness.  The patient *** restless leg symptoms which exacerbate *** for *** nights per week. The patient *** periodic limb movements which exacerbate ***  for *** nights per week. Suggest: ***  Also suggest ***  PLAN insomnia:  Patient evaluation suggests *** insomnia. This is a chronic disorder. This has been a concern for  *** and causes impaired daytime functioning. The patient exhibits comorbid ***  The history *** suggest the insomnia predates the use of hypnotic medications. The symptoms *** with the discontinuation of these medications. There is no obvious medical, psychiatric or pharmacologic abuse issues ot account for the insomnia.  Treatment recommendations include: *** The patient should maintain a sleep log and calculate total sleep time for 1-2 weeks. Set bed and wake times for achieve 85% sleep efficiency for one week. Once this is achieved  time in bed can be gradually increased. A pharmacologic treatment approach would include a trial of *** for the next ***  months. During this time the patient is to maintain a sleep diary to track progress.    ***  General Counseling: I have discussed the findings of the evaluation and examination with Kyndell.  I have also discussed any further diagnostic evaluation thatmay be needed or ordered today. Aleesha verbalizes understanding of the findings of todays visit. We also reviewed her medications today and discussed drug interactions and side effects including but not limited excessive drowsiness and altered mental states. We also discussed that there is always a risk not just to her but also people around her. she has been encouraged to call the office with any questions or concerns that should arise related to todays visit.  No orders of the defined types were placed in this encounter.       I have personally obtained a history, evaluated the patient, evaluated pertinent data, formulated the assessment and plan and placed orders.    Cordie Deters, MD [facility] Diplomate ABMS Pulmonary and Critical Care Medicine Sleep medicine

## 2023-10-16 ENCOUNTER — Ambulatory Visit: Admitting: Internal Medicine

## 2023-11-03 ENCOUNTER — Encounter: Payer: Self-pay | Admitting: Emergency Medicine

## 2023-11-03 ENCOUNTER — Ambulatory Visit
Admission: EM | Admit: 2023-11-03 | Discharge: 2023-11-03 | Disposition: A | Discharge status: Home / Self Care | Attending: Physician Assistant | Admitting: Physician Assistant

## 2023-11-03 DIAGNOSIS — R0981 Nasal congestion: Secondary | ICD-10-CM | POA: Diagnosis present

## 2023-11-03 DIAGNOSIS — R0982 Postnasal drip: Secondary | ICD-10-CM | POA: Insufficient documentation

## 2023-11-03 DIAGNOSIS — J029 Acute pharyngitis, unspecified: Secondary | ICD-10-CM | POA: Diagnosis not present

## 2023-11-03 LAB — GROUP A STREP BY PCR: Group A Strep by PCR: NOT DETECTED

## 2023-11-03 MED ORDER — IPRATROPIUM BROMIDE 0.06 % NA SOLN: 2.0000 | Freq: Four times a day (QID) | NASAL | 0 refills | Status: DC

## 2023-11-03 NOTE — ED Provider Notes (Signed)
 [facility]    CSN: [id number] Arrival date & time: 11/03/23  1006      History   Chief Complaint Chief Complaint  Patient presents with   Otalgia    bilateral   Sore Throat    HPI Karen Ewing is a 58 y.o. female presenting for approximately 2-day history of bilateral ear fullness and pressure, sore throat and nasal congestion.  History of asthma and allergies.  Has been using Flonase and Zyrtec without relief.  Patient's most concerned about her throat pain.  No associated fever, cough, chest pain, shortness of breath, vomiting or diarrhea.  Has been exposed to strep recently.  States someone looked in her friend's throat and told them they had strep and put them on antibiotics.  HPI  Past Medical History:  Diagnosis Date   Arthritis    Asthma    Hypertension    Thyroid disease     Patient Active Problem List   Diagnosis Date Noted   OSA (obstructive sleep apnea) 08/08/2022   Primary hypertension 01/04/2021    Past Surgical History:  Procedure Laterality Date   APPENDECTOMY     THYROID SURGERY      OB History   No obstetric history on file.      Home Medications    Prior to Admission medications   Medication Sig Start Date End Date Taking? Authorizing Provider  ipratropium (ATROVENT) 0.06 % nasal spray Place 2 sprays into both nostrils 4 (four) times daily. 11/03/23  Yes Nancy Axon B, PA-C  albuterol  (VENTOLIN  HFA) 108 (90 Base) MCG/ACT inhaler Inhale into the lungs. 09/25/20   [provider]  atorvastatin (LIPITOR) 20 MG tablet Take 20 mg by mouth daily.   Yes [provider]  cetirizine (ZYRTEC) 10 MG tablet Take 10 mg by mouth daily. 06/14/22  Yes [provider]  DULoxetine (CYMBALTA) 30 MG capsule Take 30 mg by mouth daily. 07/22/22   [provider]  fluticasone Odis Bennetts) 50 MCG/ACT nasal spray  04/30/21  Yes [provider]  levothyroxine (SYNTHROID) 88 MCG tablet Take 88 mcg by mouth daily.    Yes [provider]  losartan-hydrochlorothiazide (HYZAAR) 100-25 MG tablet  12/01/21  Yes [provider]  metoprolol succinate (TOPROL-XL) 25 MG 24 hr tablet Take 1 tablet by mouth daily. 07/28/22  Yes [provider]  omeprazole (PRILOSEC) 40 MG capsule Take 40 mg by mouth daily.   Yes [provider]  tiZANidine (ZANAFLEX) 2 MG tablet Take by mouth. 12/01/21  Yes [provider]    Family History Family History  Problem Relation Age of Onset   Breast cancer Neg Hx     Social History Social History   Tobacco Use   Smoking status: Former   Smokeless tobacco: Never  Vaping Use   Vaping status: Never Used  Substance Use Topics   Alcohol use: Yes    Comment: occas.    Drug use: Not Currently     Allergies   Patient has no known allergies.   Review of Systems Review of Systems  Constitutional:  Negative for chills, diaphoresis, fatigue and fever.  HENT:  Positive for congestion, ear pain, hearing loss, postnasal drip, rhinorrhea and sore throat. Negative for sinus pressure and sinus pain.   Respiratory:  Negative for cough and shortness of breath.   Gastrointestinal:  Negative for abdominal pain, nausea and vomiting.  Musculoskeletal:  Negative for arthralgias and myalgias.  Skin:  Negative for rash.  Neurological:  Negative  for weakness and headaches.  Hematological:  Negative for adenopathy.     Physical Exam Triage Vital Signs ED Triage Vitals  Encounter Vitals Group     BP      Systolic BP Percentile      Diastolic BP Percentile      Pulse      Resp      Temp      Temp src      SpO2      Weight      Height      Head Circumference      Peak Flow      Pain Score      Pain Loc      Pain Education      Exclude from Growth Chart    No data found.  Updated Vital Signs BP 126/87 (BP Location: Right Arm)   Pulse 68   Temp 99.2 F (37.3 C) (Oral)   Resp 18   Ht 5\' 8"  (1.727 m)   Wt 171 lb 15.3 oz (78 kg)    SpO2 97%   BMI 26.15 kg/m     Physical Exam Vitals and nursing note reviewed.  Constitutional:      General: She is not in acute distress.    Appearance: Normal appearance. She is not ill-appearing or toxic-appearing.  HENT:     Head: Normocephalic and atraumatic.     Right Ear: Ear canal and external ear normal. A middle ear effusion is present.     Left Ear: Ear canal and external ear normal.     Ears:     Comments: Cloudy TMs    Nose: Mucosal edema and congestion present.     Comments: +PND    Mouth/Throat:     Mouth: Mucous membranes are moist.     Pharynx: Oropharynx is clear. Posterior oropharyngeal erythema present.  Eyes:     General: No scleral icterus.       Right eye: No discharge.        Left eye: No discharge.     Conjunctiva/sclera: Conjunctivae normal.  Cardiovascular:     Rate and Rhythm: Normal rate and regular rhythm.     Heart sounds: Normal heart sounds.  Pulmonary:     Effort: Pulmonary effort is normal. No respiratory distress.     Breath sounds: Normal breath sounds.  Musculoskeletal:     Cervical back: Neck supple.  Skin:    General: Skin is dry.  Neurological:     General: No focal deficit present.     Mental Status: She is alert. Mental status is at baseline.     Motor: No weakness.     Gait: Gait normal.  Psychiatric:        Mood and Affect: Mood normal.        Behavior: Behavior normal.        Thought Content: Thought content normal.      UC Treatments / Results  Labs (all labs ordered are listed, but only abnormal results are displayed) Labs Reviewed  GROUP A STREP BY PCR    EKG   Radiology No results found.  Procedures Procedures (including critical care time)  Medications Ordered in UC Medications - No data to display  Initial Impression / Assessment and Plan / UC Course  I have reviewed the triage vital signs and the nursing notes.  Pertinent labs & imaging results that were available during my care of the  patient were reviewed by me and  considered in my medical decision making (see chart for details).   58 year old female presents for sore throat, congestion and bilateral ear pressure with fullness and reduced hearing for the past couple days.  No associated fever, cough or breathing problem.  Possible strep exposure.  Vital stable and normal and patient is overall well-appearing.  No acute distress.  On exam has nasal congestion and nasal mucosal edema, mild erythema posterior pharynx with postnasal drainage and cloudy TMs with mild effusion on the right.  Chest clear.  Heart regular rate rhythm.  PCR strep test obtained. Negative.   Viral URI versus allergies.  Advised switching to Zyrtec-D.  Sent Atrovent nasal spray.  Nasal saline rinses, rest and fluids.  Reviewed return precautions.   Final Clinical Impressions(s) / UC Diagnoses   Final diagnoses:  Acute pharyngitis, unspecified etiology  Post-nasal drainage  Nasal congestion     Discharge Instructions      - Negative strep test. - Symptoms could be related to allergies versus viral infection. - Try switching to Zyrtec-D and make sure to use the nasal spray prescription medicine or Flonase.  Nasal saline rinses. - Tylenol  or Motrin  for pain. - Should be getting better over the next week or so. - Return if fever or worsening symptoms.   ED Prescriptions     Medication Sig Dispense Auth. Provider   ipratropium (ATROVENT) 0.06 % nasal spray Place 2 sprays into both nostrils 4 (four) times daily. 15 mL Floydene Hy, PA-C      PDMP not reviewed this encounter.   Floydene Hy, PA-C 11/03/23 1109

## 2023-11-03 NOTE — Discharge Instructions (Signed)
-   Negative strep test. - Symptoms could be related to allergies versus viral infection. - Try switching to Zyrtec-D and make sure to use the nasal spray prescription medicine or Flonase.  Nasal saline rinses. - Tylenol  or Motrin  for pain. - Should be getting better over the next week or so. - Return if fever or worsening symptoms.

## 2023-11-03 NOTE — ED Triage Notes (Signed)
 Pt c/o bilateral ear pain, and sore throat. Started about 2 days ago. She states she ears have been hurting for a "while". She has been around some recently with strep.

## 2024-01-02 ENCOUNTER — Encounter: Admit: 2024-01-02 | Discharge: 2024-01-02 | Payer: Medicare (Managed Care)

## 2024-01-02 DIAGNOSIS — I10 Essential (primary) hypertension: Principal | ICD-10-CM

## 2024-01-02 DIAGNOSIS — R0602 Shortness of breath: Principal | ICD-10-CM

## 2024-01-02 DIAGNOSIS — R42 Dizziness and giddiness: Principal | ICD-10-CM

## 2024-01-02 DIAGNOSIS — F419 Anxiety disorder, unspecified: Principal | ICD-10-CM

## 2024-01-02 DIAGNOSIS — E039 Hypothyroidism, unspecified: Principal | ICD-10-CM

## 2024-01-02 DIAGNOSIS — M6289 Other specified disorders of muscle: Principal | ICD-10-CM

## 2024-01-02 DIAGNOSIS — M5412 Radiculopathy, cervical region: Principal | ICD-10-CM

## 2024-01-02 DIAGNOSIS — L439 Lichen planus, unspecified: Principal | ICD-10-CM

## 2024-01-02 DIAGNOSIS — Z0000 Encounter for general adult medical examination without abnormal findings: Principal | ICD-10-CM

## 2024-01-02 DIAGNOSIS — K638219 Small intestinal bacterial overgrowth (SIBO): Principal | ICD-10-CM

## 2024-01-02 DIAGNOSIS — J4530 Mild persistent asthma, uncomplicated: Principal | ICD-10-CM

## 2024-01-02 DIAGNOSIS — M5416 Radiculopathy, lumbar region: Principal | ICD-10-CM

## 2024-01-02 DIAGNOSIS — Z131 Encounter for screening for diabetes mellitus: Principal | ICD-10-CM

## 2024-01-02 DIAGNOSIS — Z130 Encounter for screening for diseases of the blood and blood-forming organs and certain disorders involving the immune mechanism: Principal | ICD-10-CM

## 2024-01-02 DIAGNOSIS — R002 Palpitations: Principal | ICD-10-CM

## 2024-01-02 DIAGNOSIS — E785 Hyperlipidemia, unspecified: Principal | ICD-10-CM

## 2024-01-02 DIAGNOSIS — R000 Tachycardia, unspecified: Principal | ICD-10-CM

## 2024-01-02 DIAGNOSIS — Q245 Malformation of coronary vessels: Principal | ICD-10-CM

## 2024-02-07 ENCOUNTER — Inpatient Hospital Stay: Admit: 2024-02-07 | Discharge: 2024-02-07 | Payer: Medicare (Managed Care)

## 2024-02-13 MED ORDER — METOPROLOL SUCCINATE ER 25 MG TABLET,EXTENDED RELEASE 24 HR
ORAL_TABLET | Freq: Every day | ORAL | 2 refills | 360.00000 days | Status: CP
Start: 2024-02-13 — End: ?

## 2024-03-28 ENCOUNTER — Encounter: Admit: 2024-03-28 | Discharge: 2024-03-28 | Payer: Medicare (Managed Care)

## 2024-03-28 DIAGNOSIS — L439 Lichen planus, unspecified: Principal | ICD-10-CM

## 2024-03-28 DIAGNOSIS — R748 Abnormal levels of other serum enzymes: Principal | ICD-10-CM

## 2024-03-28 DIAGNOSIS — L503 Dermatographic urticaria: Principal | ICD-10-CM

## 2024-04-02 ENCOUNTER — Emergency Department
Admit: 2024-04-02 | Discharge: 2024-04-02 | Disposition: A | Discharge status: Home / Self Care | Payer: Medicare (Managed Care)

## 2024-04-02 MED ORDER — CEPHALEXIN 500 MG CAPSULE
ORAL_CAPSULE | Freq: Four times a day (QID) | ORAL | 0 refills | 10.00000 days | Status: CP
Start: 2024-04-02 — End: 2024-04-12

## 2024-04-11 ENCOUNTER — Encounter: Admit: 2024-04-11 | Discharge: 2024-04-11 | Payer: Medicare (Managed Care)

## 2024-04-11 ENCOUNTER — Inpatient Hospital Stay: Admit: 2024-04-11 | Discharge: 2024-04-11 | Payer: Medicare (Managed Care)

## 2024-04-11 DIAGNOSIS — K638219 Small intestinal bacterial overgrowth (SIBO): Principal | ICD-10-CM

## 2024-04-26 ENCOUNTER — Encounter: Payer: Self-pay | Admitting: Emergency Medicine

## 2024-04-26 ENCOUNTER — Ambulatory Visit
Admission: EM | Admit: 2024-04-26 | Discharge: 2024-04-26 | Disposition: A | Discharge status: Home / Self Care | Attending: Emergency Medicine | Admitting: Emergency Medicine

## 2024-04-26 DIAGNOSIS — R21 Rash and other nonspecific skin eruption: Secondary | ICD-10-CM

## 2024-04-26 MED ORDER — TRIAMCINOLONE ACETONIDE 0.5 % EX OINT: 1.0000 | TOPICAL_OINTMENT | Freq: Two times a day (BID) | CUTANEOUS | 0 refills | Status: AC

## 2024-04-26 NOTE — ED Triage Notes (Signed)
 Patient reports dry and itchy skin all over that started about a year ago.  Patient denies any pain.

## 2024-04-26 NOTE — ED Provider Notes (Signed)
 [facility]    CSN: [id number] Arrival date & time: 04/26/24  1033      History   Chief Complaint Chief Complaint  Patient presents with   Pruritis    HPI Karen Ewing is a 58 y.o. female.   HPI  58 year old female with past medical history significant for thyroid disease, hypertension, asthma, and arthritis presents for evaluation of greater than 1 year worth of dry itchy skin.  Past Medical History:  Diagnosis Date   Arthritis    Asthma    Hypertension    Thyroid disease     Patient Active Problem List   Diagnosis Date Noted   OSA (obstructive sleep apnea) 08/08/2022   Primary hypertension 01/04/2021    Past Surgical History:  Procedure Laterality Date   APPENDECTOMY     THYROID SURGERY      OB History   No obstetric history on file.      Home Medications    Prior to Admission medications   Medication Sig Start Date End Date Taking? Authorizing Provider  triamcinolone ointment (KENALOG) 0.5 % Apply 1 Application topically 2 (two) times daily. 04/26/24  Yes Bernardino Ditch, NP  albuterol  (VENTOLIN  HFA) 108 (90 Base) MCG/ACT inhaler Inhale into the lungs. 09/25/20   [provider]  atorvastatin (LIPITOR) 20 MG tablet Take 20 mg by mouth daily.    [provider]  cetirizine (ZYRTEC) 10 MG tablet Take 10 mg by mouth daily. 06/14/22   [provider]  DULoxetine (CYMBALTA) 30 MG capsule Take 30 mg by mouth daily. 07/22/22   [provider]  fluticasone OREN) 50 MCG/ACT nasal spray  04/30/21   [provider]  levothyroxine (SYNTHROID) 88 MCG tablet Take 88 mcg by mouth daily.    [provider]  losartan-hydrochlorothiazide DORISE) 100-25 MG tablet  12/01/21   [provider]  metoprolol succinate (TOPROL-XL) 25 MG 24 hr tablet Take 1 tablet by mouth daily. 07/28/22   [provider]  omeprazole (PRILOSEC) 40 MG capsule Take 40 mg by mouth daily.    [provider]   tiZANidine (ZANAFLEX) 2 MG tablet Take by mouth. 12/01/21   [provider]    Family History Family History  Problem Relation Age of Onset   Breast cancer Neg Hx     Social History Social History   Tobacco Use   Smoking status: Former   Smokeless tobacco: Never  Vaping Use   Vaping status: Never Used  Substance Use Topics   Alcohol use: Yes    Comment: occas.    Drug use: Not Currently     Allergies   Patient has no known allergies.   Review of Systems Review of Systems  Skin:  Positive for rash.     Physical Exam Triage Vital Signs ED Triage Vitals  Encounter Vitals Group     BP 04/26/24 1049 (!) 167/94     Girls Systolic BP Percentile --      Girls Diastolic BP Percentile --      Boys Systolic BP Percentile --      Boys Diastolic BP Percentile --      Pulse Rate 04/26/24 1049 71     Resp 04/26/24 1049 14     Temp 04/26/24 1049 99.2 F (37.3 C)     Temp Source 04/26/24 1049 Oral     SpO2 04/26/24 1049 98 %     Weight 04/26/24 1048 180 lb (81.6 kg)     Height 04/26/24  1048 5' 7 (1.702 m)     Head Circumference --      Peak Flow --      Pain Score 04/26/24 1048 0     Pain Loc --      Pain Education --      Exclude from Growth Chart --    No data found.  Updated Vital Signs BP (!) 167/94 (BP Location: Right Arm)   Pulse 71   Temp 99.2 F (37.3 C) (Oral)   Resp 14   Ht 5' 7 (1.702 m)   Wt 180 lb (81.6 kg)   SpO2 98%   BMI 28.19 kg/m   Visual Acuity Right Eye Distance:   Left Eye Distance:   Bilateral Distance:    Right Eye Near:   Left Eye Near:    Bilateral Near:     Physical Exam Vitals and nursing note reviewed.  Constitutional:      Appearance: Normal appearance. She is not ill-appearing.  HENT:     Head: Normocephalic and atraumatic.  Skin:    General: Skin is warm and dry.     Capillary Refill: Capillary refill takes less than 2 seconds.     Findings: Rash present.  Neurological:     General: No focal deficit  present.     Mental Status: She is alert and oriented to person, place, and time.      UC Treatments / Results  Labs (all labs ordered are listed, but only abnormal results are displayed) Labs Reviewed - No data to display  EKG   Radiology No results found.  Procedures Procedures (including critical care time)  Medications Ordered in UC Medications - No data to display  Initial Impression / Assessment and Plan / UC Course  I have reviewed the triage vital signs and the nursing notes.  Pertinent labs & imaging results that were available during my care of the patient were reviewed by me and considered in my medical decision making (see chart for details).   Patient is a nontoxic-appearing 58 year old female presenting for evaluation of greater than a year worth of itchy rash and dry skin.  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  The patient reports that she does not agree with the diagnosis.    As you can see in the images above, the patient does have dry skin as well as multiple hypo and hyperpigmented raised, scaly lesions which are suggestive of lichen planus.  I have advised the patient that there is no cure for lichen planus and that all we can do is manage symptoms.  She reports that she has been prescribed creams in the past by dermatology.  I will try a higher potency steroid that she can apply twice daily to help with the skin lesions.  I have also advised her that she needs to use a good-quality lotion, especially after shower when her skin is most readily going to absorb the moisture.  I will also refer her to dermatology for a second opinion.   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  For your dry skin I recommend using a  good-quality lotion such as Eucerin or CeraVea.  Apply it essentially get out of the shower when your skin  is most able to absorb the moisture.  You may also reapply the lotion as often as necessary throughout the day to help with your dry skin.  Your thyroid condition may be contributing to your dry skin.  I have referred you to dermatology for a second opinion.  They will contact you to make an appointment.  The results of your upper endoscopy did not show any evidence of H. pylori or abnormal cellular growth.  I would make an appointment with gastroenterology to discuss repeat hydrogen breath testing to evaluate for the eradication of SIBO.     ED Prescriptions     Medication Sig Dispense Auth. Provider   triamcinolone ointment (KENALOG) 0.5 % Apply 1 Application topically 2 (two) times daily. 30 g Bernardino Ditch, NP      PDMP not reviewed this encounter.   Bernardino Ditch, NP 04/26/24 [phone]

## 2024-04-26 NOTE — Discharge Instructions (Addendum)
 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  For your dry skin I recommend using a good-quality lotion such as Eucerin or CeraVea.  Apply it essentially get out of the shower when your skin is most able to absorb the moisture.  You may also reapply the lotion as often as necessary throughout the day to help with your dry skin.  Your thyroid condition may be contributing to your dry skin.  I have referred you to dermatology for a second opinion.  They will contact you to make an appointment.  The results of your upper endoscopy did not show any evidence of H. pylori or abnormal cellular growth.  I would make an appointment with gastroenterology to discuss repeat hydrogen breath testing to evaluate for the eradication of SIBO.

## 2024-06-10 ENCOUNTER — Ambulatory Visit

## 2024-06-10 ENCOUNTER — Ambulatory Visit
Admission: EM | Admit: 2024-06-10 | Discharge: 2024-06-10 | Disposition: A | Discharge status: Home / Self Care | Attending: Emergency Medicine | Admitting: Emergency Medicine

## 2024-06-10 ENCOUNTER — Ambulatory Visit: Payer: Self-pay | Admitting: Emergency Medicine

## 2024-06-10 DIAGNOSIS — N390 Urinary tract infection, site not specified: Secondary | ICD-10-CM | POA: Insufficient documentation

## 2024-06-10 DIAGNOSIS — R1013 Epigastric pain: Secondary | ICD-10-CM | POA: Insufficient documentation

## 2024-06-10 DIAGNOSIS — R319 Hematuria, unspecified: Secondary | ICD-10-CM | POA: Diagnosis not present

## 2024-06-10 LAB — POCT URINE DIPSTICK
Bilirubin, UA: NEGATIVE
Blood, UA: NEGATIVE
Glucose, UA: NEGATIVE mg/dL
Nitrite, UA: NEGATIVE
Protein Ur, POC: NEGATIVE mg/dL
Spec Grav, UA: 1.025
Urobilinogen, UA: 0.2 U/dL
pH, UA: 5.5

## 2024-06-10 MED ORDER — LIDOCAINE VISCOUS HCL 2 % MT SOLN
15.0000 mL | Freq: Once | OROMUCOSAL | Status: AC
  Administered 2024-06-10: 15 mL via OROMUCOSAL

## 2024-06-10 MED ORDER — OMEPRAZOLE 40 MG PO CPDR: 40.0000 mg | DELAYED_RELEASE_CAPSULE | Freq: Every day | ORAL | 0 refills | Status: AC

## 2024-06-10 MED ORDER — FAMOTIDINE 20 MG PO TABS: 20.0000 mg | ORAL_TABLET | Freq: Two times a day (BID) | ORAL | 0 refills | Status: AC

## 2024-06-10 MED ORDER — ALUM & MAG HYDROXIDE-SIMETH 200-200-20 MG/5ML PO SUSP
30.0000 mL | Freq: Once | ORAL | Status: AC
  Administered 2024-06-10: 30 mL via ORAL

## 2024-06-10 MED ORDER — CEPHALEXIN 500 MG PO CAPS: 500.0000 mg | ORAL_CAPSULE | Freq: Two times a day (BID) | ORAL | 0 refills | Status: AC

## 2024-06-10 NOTE — ED Provider Notes (Signed)
 " HPI  SUBJECTIVE:  Karen Ewing is a 58 y.o. female who presents with 8 days of constant nonmigratory, nonradiating epigastric pain that got worse today.  She reports cough productive of white sputum, fevers Tmax 101, intermittent, minutes long shortness of breath, wheezing, dyspnea on exertion, nausea, and nonbilious, nonbloody emesis, and generalized weakness.  No emesis in the past 3 days.  She had a normal bowel movement this morning with no change in her pain.  She was able to eat a sandwich yesterday which made her abdominal pain worse.  She reports urinary frequency, urgency, cloudy and odorous urine.  No change in urine output, dysuria, hematuria, pelvic, back pain.  No abdominal distention.  She has had symptoms like this before and was found to have gastritis.  She tried heating pad with some improvement in her symptoms.  Symptoms worse with eating.  There is no exertional component to it.  No recent antibiotics.  She has a past medical history of asthma, hypertension, hypothyroidism, hypercholesterolemia, fatty liver disease, gastritis, pancreatitis, small intestinal bacterial overgrowth, H. pylori infection, UTI, status post appendectomy.  No history of alcohol use, excess NSAID use, peptic ulcer disease, gallbladder disease, MI, coronary disease, PAD/PVD, CVA.  No history of pyonephritis, nephrolithiasis, diverticulitis.  Family history significant for father with MI at age 9.  PCP: [facility].  GI: [facility].  She was seen in the ER 1 week ago on 12/22 for right upper quadrant and epigastric abdominal pain.  UA negative for UTI.  Hemoglobin A1c 5.9.  Troponin x 2, lipase, LFTs, CBC normal.  Right upper quadrant ultrasound, abdomen CT pelvis was normal.  She did have elevated creatinine 1.27.  Previous creatinine in October 2025 1.19.  She states that the pain that she presents with today is identical to the pain that sent her to the ER.  Past Medical History:  Diagnosis Date    Arthritis    Asthma    Hypertension    Thyroid disease     Past Surgical History:  Procedure Laterality Date   APPENDECTOMY     THYROID SURGERY      Family History  Problem Relation Age of Onset   Breast cancer Neg Hx     Social History[1]  Current Medications[2]  Allergies[3]   ROS  As noted in HPI.   Physical Exam  BP 121/89 (BP Location: Left Arm)   Pulse 84   Temp 98.9 F (37.2 C) (Oral)   Resp 18   SpO2 97%   Constitutional: Well developed, well nourished, no acute distress Eyes: PERRL, EOMI, conjunctiva normal bilaterally HENT: Normocephalic, atraumatic,mucus membranes moist Respiratory: Clear to auscultation bilaterally, no rales, no wheezing, no rhonchi Cardiovascular: Normal rate and rhythm, no murmurs, no gallops, no rubs GI: Soft, nondistended, normal bowel sounds, diffuse tenderness maximal in the epigastric region.  Negative Murphy.  No rebound, guarding.  No flank tenderness.  Questionable positive tap table test, but patient moving around comfortably Back: Questionable right CVAT skin: No rash, skin intact Musculoskeletal: No edema, no tenderness, no deformities Neurologic: Alert & oriented x 3, CN III-XII grossly intact, no motor deficits, sensation grossly intact Psychiatric: Speech and behavior appropriate   ED Course   Medications  lidocaine  (XYLOCAINE ) 2 % viscous mouth solution 15 mL (15 mLs Mouth/Throat Given 06/10/24 1528)  alum & mag hydroxide-simeth (MAALOX/MYLANTA) 200-200-20 MG/5ML suspension 30 mL (30 mLs Oral Given 06/10/24 1528)    Orders Placed This Encounter  Procedures   Urine Culture  Standing Status:   Standing    Number of Occurrences:   1    Indication:   Dysuria   DG Chest 2 View    Standing Status:   Standing    Number of Occurrences:   1    Reason for Exam (SYMPTOM  OR DIAGNOSIS REQUIRED):   cough SOB r/o acute cardiopulm dz   DG Abd 1 View    Standing Status:   Standing    Number of Occurrences:   1     Reason for Exam (SYMPTOM  OR DIAGNOSIS REQUIRED):   Epigastric pain, rule out free air, nephrolithiasis   POC Urinalysis Dipstick    Standing Status:   Standing    Number of Occurrences:   1   ED EKG    Epigastric pain    Standing Status:   Standing    Number of Occurrences:   1    Reason for Exam:   Other (see Comments)   EKG 12-Lead    Standing Status:   Standing    Number of Occurrences:   1   EKG 12-Lead    Standing Status:   Standing    Number of Occurrences:   1   Results for orders placed or performed during the hospital encounter of 06/10/24 (from the past 24 hours)  POC Urinalysis Dipstick     Status: Abnormal   Collection Time: 06/10/24  3:57 PM  Result Value Ref Range   Color, UA yellow yellow   Clarity, UA clear clear   Glucose, UA negative negative mg/dL   Bilirubin, UA negative negative   Ketones, POC UA trace (5) (A) negative mg/dL   Spec Grav, UA 8.974 8.989 - 1.025   Blood, UA negative negative   pH, UA 5.5 5.0 - 8.0   Protein Ur, POC negative negative mg/dL   Urobilinogen, UA 0.2 0.2 or 1.0 E.U./dL   Nitrite, UA Negative Negative   Leukocytes, UA Trace (A) Negative   DG Abd 1 View Result Date: 06/10/2024 CLINICAL DATA:  Epigastric pain. EXAM: ABDOMEN - 1 VIEW COMPARISON:  None Available. FINDINGS: No bowel dilatation to suggest obstruction. Air within nondilated small bowel centrally. Minimal formed stool in the colon. No evidence of free air in the supine views. 3 mm calcification in the right upper quadrant. No acute osseous findings. IMPRESSION: 1. Normal bowel gas pattern.  No evidence of free air. 2. A 3 mm calcification in the right upper quadrant, possibly renal stone. Electronically Signed   By: Andrea Gasman M.D.   On: 06/10/2024 17:39   DG Chest 2 View Result Date: 06/10/2024 CLINICAL DATA:  Cough and shortness of breath. EXAM: CHEST - 2 VIEW COMPARISON:  02/27/2020 FINDINGS: The cardiomediastinal contours are normal. The lungs are clear.  Pulmonary vasculature is normal. No consolidation, pleural effusion, or pneumothorax. No acute osseous abnormalities are seen. IMPRESSION: No active cardiopulmonary disease. Electronically Signed   By: Andrea Gasman M.D.   On: 06/10/2024 17:38    ED Clinical Impression  1. Epigastric pain   2. Urinary tract infection with hematuria, site unspecified      ED Assessment/Plan     ER records, labs reviewed.  As noted in HPI.  Discussed with patient that we have very limited capabilities to evaluate this, but feel comfortable with doing limited evaluation given recent negative comprehensive ER evaluation on 12/22.  Will check a chest x-ray, KUB, UA and EKG, and give a GI cocktail.  Differential for epigastric pain includes  ACS, PUD, perforated peptic ulcer, gastritis, nephrolithiasis, colitis, obstruction, constipation.  Low suspicion for cholelithiasis, pancreatitis, especially since these were ruled out recently in the ED.  Low suspicion for mesenteric ischemia in the absence of atrial fibrillation or hypercoagulability.  Low suspicion for SBO.  Her abdomen is benign.  There is no guarding or rebound.   Reviewed imaging independently.  No acute cardiopulmonary disease.  No free air.  Bowel gas scattered throughout.  Formal radiology overread pending.  Will contact patient if radiology overread differs enough from line and we need to change management.  Reviewed radiology report.  No acute cardiopulmonary disease consistent with my read.  3 mm calcification right upper quadrant, possible renal stone.  See radiology report for full details.  UA with ketones, trace leukocytes.  Will send this off for culture.  Will send home with Keflex  500 mg p.o. twice daily.  She denies antibiotics in the past month  EKG: Normal sinus rhythm, rate 68.  Left axis deviation.  Normal intervals.  No hypertrophy.  No ST-T wave changes.  No change compared to EKG from 2022  Will send home with Pepcid  20 mg  daily and refill her omeprazole 40 mg daily and if her symptoms change or get worse, she is to go immediately to the emergency department.  She is to call her GI provider and let them know that she is having abdominal pain.  On reevaluation, patient states she is feeling better.   Discussed labs, imaging, MDM, treatment plan, and plan for follow-up with patient Discussed sn/sx that should prompt return to the ED. patient agrees with plan.   Meds ordered this encounter  Medications   lidocaine  (XYLOCAINE ) 2 % viscous mouth solution 15 mL   alum & mag hydroxide-simeth (MAALOX/MYLANTA) 200-200-20 MG/5ML suspension 30 mL   famotidine  (PEPCID ) 20 MG tablet    Sig: Take 1 tablet (20 mg total) by mouth 2 (two) times daily.    Dispense:  60 tablet    Refill:  0   omeprazole (PRILOSEC) 40 MG capsule    Sig: Take 1 capsule (40 mg total) by mouth daily.    Dispense:  30 capsule    Refill:  0   cephALEXin  (KEFLEX ) 500 MG capsule    Sig: Take 1 capsule (500 mg total) by mouth 2 (two) times daily.    Dispense:  14 capsule    Refill:  0      *This clinic note was created using Scientist, clinical (histocompatibility and immunogenetics). Therefore, there may be occasional mistakes despite careful proofreading. ?      [1]  Social History Tobacco Use   Smoking status: Former   Smokeless tobacco: Never  Vaping Use   Vaping status: Never Used  Substance Use Topics   Alcohol use: Yes    Comment: occas.    Drug use: Not Currently  [2] No current facility-administered medications for this encounter.  Current Outpatient Medications:    cephALEXin  (KEFLEX ) 500 MG capsule, Take 1 capsule (500 mg total) by mouth 2 (two) times daily., Disp: 14 capsule, Rfl: 0   famotidine  (PEPCID ) 20 MG tablet, Take 1 tablet (20 mg total) by mouth 2 (two) times daily., Disp: 60 tablet, Rfl: 0   albuterol  (VENTOLIN  HFA) 108 (90 Base) MCG/ACT inhaler, Inhale into the lungs., Disp: , Rfl:    atorvastatin (LIPITOR) 20 MG tablet, Take 20 mg by mouth  daily., Disp: , Rfl:    cetirizine (ZYRTEC) 10 MG tablet, Take 10 mg by mouth daily.,  Disp: , Rfl:    DULoxetine (CYMBALTA) 30 MG capsule, Take 30 mg by mouth daily., Disp: , Rfl:    fluticasone (FLONASE) 50 MCG/ACT nasal spray, , Disp: , Rfl:    levothyroxine (SYNTHROID) 88 MCG tablet, Take 88 mcg by mouth daily., Disp: , Rfl:    losartan-hydrochlorothiazide (HYZAAR) 100-25 MG tablet, , Disp: , Rfl:    metoprolol succinate (TOPROL-XL) 25 MG 24 hr tablet, Take 1 tablet by mouth daily., Disp: , Rfl:    omeprazole (PRILOSEC) 40 MG capsule, Take 1 capsule (40 mg total) by mouth daily., Disp: 30 capsule, Rfl: 0   tiZANidine (ZANAFLEX) 2 MG tablet, Take by mouth., Disp: , Rfl:    triamcinolone  ointment (KENALOG ) 0.5 %, Apply 1 Application topically 2 (two) times daily., Disp: 30 g, Rfl: 0 [3] No Known Allergies    Van Knee, MD 06/11/24 [phone]  "

## 2024-06-10 NOTE — ED Triage Notes (Signed)
 Patient presents to UC for abdominal pain, nausea, productive cough, SOB, dizzy x 1 week. States she was seen in the ED and they prescribed her zofran. She has not taking any meds for symptoms.

## 2024-06-10 NOTE — Discharge Instructions (Addendum)
 I did not appreciate any acute issues with your chest x-ray or abdominal x-ray. We will contact you if the radiology overread differs enough from mine.   Your EKG was normal.  Urinalysis is suggestive of a urinary tract infection.  I will send this off for culture make sure we have you on the correct antibiotic.  I am to treat you for presumed gastritis with omeprazole and Pepcid .  Go to the ER if your symptoms change, get worse, do not respond to the omeprazole Pepcid  and in few days, or for any other concerns.

## 2024-06-11 LAB — URINE CULTURE: Culture: <10000 — AB

## 2024-06-24 DIAGNOSIS — Z1231 Encounter for screening mammogram for malignant neoplasm of breast: Principal | ICD-10-CM

## 2024-07-02 ENCOUNTER — Encounter: Admit: 2024-07-02 | Discharge: 2024-07-02 | Payer: Medicare (Managed Care) | Attending: Family | Primary: Family

## 2024-07-02 DIAGNOSIS — I10 Essential (primary) hypertension: Principal | ICD-10-CM

## 2024-07-02 DIAGNOSIS — R0982 Postnasal drip: Principal | ICD-10-CM

## 2024-07-02 DIAGNOSIS — R0981 Nasal congestion: Principal | ICD-10-CM

## 2024-07-02 DIAGNOSIS — J329 Chronic sinusitis, unspecified: Principal | ICD-10-CM

## 2024-07-02 DIAGNOSIS — N289 Disorder of kidney and ureter, unspecified: Principal | ICD-10-CM

## 2024-07-02 DIAGNOSIS — K760 Fatty (change of) liver, not elsewhere classified: Principal | ICD-10-CM

## 2024-07-02 DIAGNOSIS — E039 Hypothyroidism, unspecified: Principal | ICD-10-CM

## 2024-07-02 DIAGNOSIS — J3489 Other specified disorders of nose and nasal sinuses: Principal | ICD-10-CM

## 2024-07-02 MED ORDER — AMOXICILLIN 875 MG-POTASSIUM CLAVULANATE 125 MG TABLET
ORAL_TABLET | Freq: Two times a day (BID) | ORAL | 0 refills | 10.00000 days | Status: CP
Start: 2024-07-02 — End: 2024-07-12
